# Patient Record
Sex: Female | Born: 1965
Health system: Southern US, Community
[De-identification: ages and names within clinical notes are randomized; demographics above are authoritative.]

## PROBLEM LIST (undated history)

## (undated) DIAGNOSIS — F419 Anxiety disorder, unspecified: Secondary | ICD-10-CM

## (undated) DIAGNOSIS — E7800 Pure hypercholesterolemia, unspecified: Secondary | ICD-10-CM

## (undated) HISTORY — PX: TUBAL LIGATION: SHX77

## (undated) HISTORY — PX: COLONOSCOPY: SHX174

## (undated) HISTORY — DX: Pure hypercholesterolemia, unspecified: E78.00

## (undated) HISTORY — DX: Anxiety disorder, unspecified: F41.9

## (undated) HISTORY — PX: WISDOM TOOTH EXTRACTION: SHX21

## (undated) HISTORY — PX: MANDIBLE FRACTURE SURGERY: SHX706

---

## 1995-03-25 DIAGNOSIS — O321XX0 Maternal care for breech presentation, not applicable or unspecified: Secondary | ICD-10-CM

## 2004-08-28 ENCOUNTER — Ambulatory Visit: Payer: Self-pay | Admitting: General Practice

## 2005-08-27 ENCOUNTER — Ambulatory Visit: Payer: Self-pay | Admitting: General Practice

## 2006-09-03 ENCOUNTER — Ambulatory Visit: Payer: Self-pay | Admitting: General Practice

## 2007-10-11 ENCOUNTER — Ambulatory Visit: Payer: Self-pay

## 2008-09-20 ENCOUNTER — Ambulatory Visit: Payer: Self-pay | Admitting: General Practice

## 2010-10-02 ENCOUNTER — Ambulatory Visit: Payer: Self-pay

## 2010-11-25 ENCOUNTER — Ambulatory Visit: Payer: Self-pay

## 2010-11-26 ENCOUNTER — Ambulatory Visit: Payer: Self-pay

## 2010-11-27 LAB — PATHOLOGY REPORT

## 2011-10-08 ENCOUNTER — Ambulatory Visit: Payer: Self-pay

## 2012-10-13 ENCOUNTER — Ambulatory Visit: Payer: Self-pay | Admitting: Obstetrics and Gynecology

## 2014-10-23 ENCOUNTER — Ambulatory Visit: Payer: Self-pay | Admitting: Obstetrics and Gynecology

## 2014-10-23 LAB — HM MAMMOGRAPHY

## 2014-10-25 ENCOUNTER — Ambulatory Visit: Payer: Self-pay | Admitting: Obstetrics and Gynecology

## 2015-04-23 LAB — HM PAP SMEAR: HM Pap smear: NEGATIVE

## 2015-10-05 ENCOUNTER — Other Ambulatory Visit: Payer: Self-pay | Admitting: Family Medicine

## 2015-10-05 DIAGNOSIS — Z1231 Encounter for screening mammogram for malignant neoplasm of breast: Secondary | ICD-10-CM

## 2015-10-31 ENCOUNTER — Ambulatory Visit
Admission: RE | Admit: 2015-10-31 | Discharge: 2015-10-31 | Disposition: A | Discharge status: Home / Self Care | Payer: BLUE CROSS/BLUE SHIELD | Source: Ambulatory Visit | Attending: Family Medicine | Admitting: Family Medicine

## 2015-10-31 DIAGNOSIS — Z1231 Encounter for screening mammogram for malignant neoplasm of breast: Secondary | ICD-10-CM | POA: Insufficient documentation

## 2015-11-12 DIAGNOSIS — N946 Dysmenorrhea, unspecified: Secondary | ICD-10-CM | POA: Diagnosis not present

## 2015-11-12 DIAGNOSIS — N920 Excessive and frequent menstruation with regular cycle: Secondary | ICD-10-CM | POA: Diagnosis not present

## 2015-11-21 DIAGNOSIS — N920 Excessive and frequent menstruation with regular cycle: Secondary | ICD-10-CM | POA: Diagnosis not present

## 2015-11-21 DIAGNOSIS — N946 Dysmenorrhea, unspecified: Secondary | ICD-10-CM | POA: Diagnosis not present

## 2016-10-14 ENCOUNTER — Ambulatory Visit (INDEPENDENT_AMBULATORY_CARE_PROVIDER_SITE_OTHER): Payer: BLUE CROSS/BLUE SHIELD | Admitting: Obstetrics and Gynecology

## 2016-10-14 ENCOUNTER — Encounter: Payer: Self-pay | Admitting: Obstetrics and Gynecology

## 2016-10-14 VITALS — BP 140/90 | HR 78 | Ht 65.0 in | Wt 160.0 lb

## 2016-10-14 DIAGNOSIS — Z1231 Encounter for screening mammogram for malignant neoplasm of breast: Secondary | ICD-10-CM

## 2016-10-14 DIAGNOSIS — Z1211 Encounter for screening for malignant neoplasm of colon: Secondary | ICD-10-CM | POA: Diagnosis not present

## 2016-10-14 DIAGNOSIS — Z1239 Encounter for other screening for malignant neoplasm of breast: Secondary | ICD-10-CM

## 2016-10-14 DIAGNOSIS — Z720 Tobacco use: Secondary | ICD-10-CM | POA: Diagnosis not present

## 2016-10-14 DIAGNOSIS — Z01419 Encounter for gynecological examination (general) (routine) without abnormal findings: Secondary | ICD-10-CM

## 2016-10-14 DIAGNOSIS — N951 Menopausal and female climacteric states: Secondary | ICD-10-CM | POA: Diagnosis not present

## 2016-10-14 LAB — HEMOCCULT GUIAC POC 1CARD (OFFICE): FECAL OCCULT BLD: NEGATIVE

## 2016-10-14 NOTE — Progress Notes (Signed)
HPI:      [name] is a 51 y.o. No obstetric history on file. who LMP was Patient's last menstrual period was 10/11/2016., presents today for her annual examination.  Her menses are infrequent since perimenopausal. She had gone 6 months without menses. She had menorrhagia/DUB last yr and had neg eval/bx/SHGM with Dr. Georgianne Fick. He started her on camila OCPs. Pt stopped them due to bloating and odor. She has not had any DUB sx since.   Dysmenorrhea none.   She does have vasomotor sx. Tolerable.  She is single partner, contraception - tubal ligation. She does not have vaginal dryness.  Last Pap: April 23, 2015  Results were: no abnormalities /neg HPV DNA.  Hx of STDs: none  Last mammogram: October 31, 2015  Results were: normal--routine follow-up in 12 months There is no FH of breast cancer. There is no FH of ovarian cancer. The patient does do self-breast exams.  Colonoscopy: not done.   Tobacco use: The patient currently smokes 1/2  packs of cigarettes per day for the past many years. Alcohol use: social drinker Exercise: not active  She does get adequate calcium and Vitamin D in her diet.   Past Medical History:  Diagnosis Date  . Anxiety    SITUATIONAL  . Hypercholesteremia     Past Surgical History:  Procedure Laterality Date  . CESAREAN SECTION  03/25/1995   BREECH  . MANDIBLE FRACTURE SURGERY     AGE 77  . TUBAL LIGATION     PJR  . WISDOM TOOTH EXTRACTION     20S    Family History  Problem Relation Age of Onset  . Hyperlipidemia Mother   . Heart disease Father   . Stroke Father   . Diabetes Father   . Hyperlipidemia Sister      ROS:  Review of Systems  Constitutional: Negative for fever, malaise/fatigue and weight loss.  HENT: Negative for congestion, ear pain and sinus pain.   Respiratory: Negative for cough, shortness of breath and wheezing.   Cardiovascular: Negative for chest pain, orthopnea and leg swelling.  Gastrointestinal:  Negative for constipation, diarrhea, nausea and vomiting.  Genitourinary: Negative for dysuria, frequency, hematuria and urgency.       Breast ROS: negative   Musculoskeletal: Negative for back pain, joint pain and myalgias.  Skin: Negative for itching and rash.  Neurological: Negative for dizziness, tingling, focal weakness and headaches.  Endo/Heme/Allergies: Negative for environmental allergies. Does not bruise/bleed easily.  Psychiatric/Behavioral: Negative for depression and suicidal ideas. The patient is not nervous/anxious and does not have insomnia.     Objective: BP 140/90 (Patient Position: Sitting)   Pulse 78   Wt 160 lb (72.6 kg)   LMP 10/11/2016    Physical Exam  Genitourinary: Rectum normal. Rectal exam shows guaiac negative stool.    Results: Results for orders placed or performed in visit on 10/14/16 (from the past 24 hour(s))  POCT Occult Blood Stool     Status: Normal   Collection Time: 10/14/16  4:01 PM  Result Value Ref Range   Fecal Occult Blood, POC Negative Negative   Card #1 Date     Card #2 Fecal Occult Blod, POC     Card #2 Date     Card #3 Fecal Occult Blood, POC     Card #3 Date      Assessment/Plan: Encounter for annual routine gynecological examination  Screening for breast cancer - Plan: MM DIGITAL SCREENING BILATERAL  Screening for colon cancer - Neg FOBT. Pt to consider GI ref for scr colonoscopy due to age. - Plan: POCT Occult Blood Stool  Perimenopause - F/u prn DUB.   Tobacco use - Encouraged cessation.            GYN counsel mammography screening, menopause, adequate intake of calcium and vitamin D     F/U  Return in about 1 year (around 10/14/2017).  Gorden Stthomas B. Ireoluwa Gorsline, PA-C 10/14/2016 4:03 PM

## 2017-01-02 ENCOUNTER — Ambulatory Visit
Admission: RE | Admit: 2017-01-02 | Discharge: 2017-01-02 | Disposition: A | Discharge status: Home / Self Care | Payer: BLUE CROSS/BLUE SHIELD | Source: Ambulatory Visit | Attending: Obstetrics and Gynecology | Admitting: Obstetrics and Gynecology

## 2017-01-02 DIAGNOSIS — Z1231 Encounter for screening mammogram for malignant neoplasm of breast: Secondary | ICD-10-CM | POA: Insufficient documentation

## 2017-04-29 DIAGNOSIS — H524 Presbyopia: Secondary | ICD-10-CM | POA: Diagnosis not present

## 2017-10-13 DIAGNOSIS — Z1211 Encounter for screening for malignant neoplasm of colon: Secondary | ICD-10-CM | POA: Diagnosis not present

## 2017-10-13 DIAGNOSIS — Z01818 Encounter for other preprocedural examination: Secondary | ICD-10-CM | POA: Diagnosis not present

## 2017-10-20 ENCOUNTER — Other Ambulatory Visit: Payer: Self-pay | Admitting: Family Medicine

## 2017-11-30 DIAGNOSIS — D125 Benign neoplasm of sigmoid colon: Secondary | ICD-10-CM | POA: Diagnosis not present

## 2017-11-30 DIAGNOSIS — D126 Benign neoplasm of colon, unspecified: Secondary | ICD-10-CM | POA: Diagnosis not present

## 2017-11-30 DIAGNOSIS — K648 Other hemorrhoids: Secondary | ICD-10-CM | POA: Diagnosis not present

## 2017-11-30 DIAGNOSIS — D123 Benign neoplasm of transverse colon: Secondary | ICD-10-CM | POA: Diagnosis not present

## 2017-11-30 DIAGNOSIS — K640 First degree hemorrhoids: Secondary | ICD-10-CM | POA: Diagnosis not present

## 2017-11-30 DIAGNOSIS — Z1211 Encounter for screening for malignant neoplasm of colon: Secondary | ICD-10-CM | POA: Diagnosis not present

## 2018-01-28 DIAGNOSIS — R51 Headache: Secondary | ICD-10-CM | POA: Diagnosis not present

## 2018-01-28 DIAGNOSIS — I10 Essential (primary) hypertension: Secondary | ICD-10-CM | POA: Diagnosis not present

## 2018-03-15 DIAGNOSIS — L570 Actinic keratosis: Secondary | ICD-10-CM | POA: Diagnosis not present

## 2018-03-15 DIAGNOSIS — L905 Scar conditions and fibrosis of skin: Secondary | ICD-10-CM | POA: Diagnosis not present

## 2018-03-15 DIAGNOSIS — D224 Melanocytic nevi of scalp and neck: Secondary | ICD-10-CM | POA: Diagnosis not present

## 2018-03-15 DIAGNOSIS — L739 Follicular disorder, unspecified: Secondary | ICD-10-CM | POA: Diagnosis not present

## 2018-03-15 DIAGNOSIS — L821 Other seborrheic keratosis: Secondary | ICD-10-CM | POA: Diagnosis not present

## 2018-03-25 ENCOUNTER — Other Ambulatory Visit: Payer: Self-pay | Admitting: Physician Assistant

## 2018-03-25 DIAGNOSIS — Z1231 Encounter for screening mammogram for malignant neoplasm of breast: Secondary | ICD-10-CM

## 2018-04-12 ENCOUNTER — Other Ambulatory Visit (HOSPITAL_COMMUNITY)
Admission: RE | Admit: 2018-04-12 | Discharge: 2018-04-12 | Disposition: A | Discharge status: Home / Self Care | Payer: BLUE CROSS/BLUE SHIELD | Source: Ambulatory Visit | Attending: Obstetrics and Gynecology | Admitting: Obstetrics and Gynecology

## 2018-04-12 ENCOUNTER — Encounter: Payer: Self-pay | Admitting: Obstetrics and Gynecology

## 2018-04-12 ENCOUNTER — Ambulatory Visit (INDEPENDENT_AMBULATORY_CARE_PROVIDER_SITE_OTHER): Payer: BLUE CROSS/BLUE SHIELD | Admitting: Obstetrics and Gynecology

## 2018-04-12 VITALS — BP 100/60 | HR 72 | Ht 65.0 in | Wt 161.0 lb

## 2018-04-12 DIAGNOSIS — Z1151 Encounter for screening for human papillomavirus (HPV): Secondary | ICD-10-CM | POA: Insufficient documentation

## 2018-04-12 DIAGNOSIS — Z124 Encounter for screening for malignant neoplasm of cervix: Secondary | ICD-10-CM | POA: Insufficient documentation

## 2018-04-12 DIAGNOSIS — Z01419 Encounter for gynecological examination (general) (routine) without abnormal findings: Secondary | ICD-10-CM

## 2018-04-12 DIAGNOSIS — Z1231 Encounter for screening mammogram for malignant neoplasm of breast: Secondary | ICD-10-CM | POA: Diagnosis not present

## 2018-04-12 DIAGNOSIS — Z1239 Encounter for other screening for malignant neoplasm of breast: Secondary | ICD-10-CM

## 2018-04-12 NOTE — Progress Notes (Signed)
HPI:      [name] is a 52 y.o. No obstetric history on file. who LMP was No LMP recorded., presents today for her annual examination.  Her menses are absent due to menopause. No DUB sx.   She does have vasomotor sx. Tolerable.  She is single partner, contraception - tubal ligation. She does not have vaginal dryness.  Last Pap: April 23, 2015  Results were: no abnormalities /neg HPV DNA.  Hx of STDs: none  Last mammogram: 01/02/17 Results were: normal--routine follow-up in 12 months. Has appt 04/16/18.  There is no FH of breast cancer. There is no FH of ovarian cancer. The patient does do self-breast exams.  Colonoscopy: this yr with polyps; repeat after 3 yrs  Tobacco use: The patient currently smokes 1/2  packs of cigarettes per day for the past many years. Alcohol use: social drinker Exercise: mod active  She does get adequate calcium but not Vitamin D in her diet. Labs with PCP   Past Medical History:  Diagnosis Date  . Anxiety    SITUATIONAL  . Hypercholesteremia     Past Surgical History:  Procedure Laterality Date  . CESAREAN SECTION  03/25/1995   BREECH  . MANDIBLE FRACTURE SURGERY     AGE 32  . TUBAL LIGATION     PJR  . WISDOM TOOTH EXTRACTION     20S    Family History  Problem Relation Age of Onset  . Hyperlipidemia Mother   . Heart disease Father   . Stroke Father   . Diabetes Father   . Hyperlipidemia Sister   . Breast cancer Neg Hx      ROS:  Review of Systems  Constitutional: Negative for fever, malaise/fatigue and weight loss.  HENT: Negative for congestion, ear pain and sinus pain.   Respiratory: Negative for cough, shortness of breath and wheezing.   Cardiovascular: Negative for chest pain, orthopnea and leg swelling.  Gastrointestinal: Negative for constipation, diarrhea, nausea and vomiting.  Genitourinary: Negative for dysuria, frequency, hematuria and urgency.       Breast ROS: negative   Musculoskeletal: Negative  for back pain, joint pain and myalgias.  Skin: Negative for itching and rash.  Neurological: Negative for dizziness, tingling, focal weakness and headaches.  Endo/Heme/Allergies: Negative for environmental allergies. Does not bruise/bleed easily.  Psychiatric/Behavioral: Negative for depression and suicidal ideas. The patient is not nervous/anxious and does not have insomnia.     Objective: BP 100/60   Pulse 72   Ht 5\' 5"  (1.651 m)   Wt 161 lb (73 kg)   BMI 26.79 kg/m    Physical Exam  Constitutional: She is oriented to person, place, and time. She appears well-developed and well-nourished.  Genitourinary: Vagina normal and uterus normal. There is no rash or tenderness on the right labia. There is no rash or tenderness on the left labia. No erythema or tenderness in the vagina. No vaginal discharge found. Right adnexum does not display mass and does not display tenderness. Left adnexum does not display mass and does not display tenderness. Cervix does not exhibit motion tenderness or polyp. Uterus is not enlarged or tender.  Neck: Normal range of motion. No thyromegaly present.  Cardiovascular: Normal rate, regular rhythm and normal heart sounds.  No murmur heard. Pulmonary/Chest: Effort normal and breath sounds normal. Right breast exhibits no mass, no nipple discharge, no skin change and no tenderness. Left breast exhibits no mass, no nipple discharge, no skin change and no  tenderness.  Abdominal: Soft. There is no tenderness. There is no guarding.  Musculoskeletal: Normal range of motion.  Neurological: She is alert and oriented to person, place, and time. No cranial nerve deficit.  Psychiatric: She has a normal mood and affect. Her behavior is normal.  Vitals reviewed.   Assessment/Plan: Encounter for annual routine gynecological examination  Cervical cancer screening - Plan: Cytology - PAP  Screening for HPV (human papillomavirus) - Plan: Cytology - PAP  Screening for breast  cancer - Pt has mammo sched.          GYN counsel mammography screening, menopause, adequate intake of calcium and vitamin D     F/U  Return in about 1 year (around 04/13/2019).  Alicia B. Copland, PA-C 04/12/2018 3:14 PM

## 2018-04-12 NOTE — Patient Instructions (Signed)
I value your feedback and entrusting us with your care. If you get a [facility] patient survey, I would appreciate you taking the time to let us know about your experience today. Thank you! 

## 2018-04-15 LAB — CYTOLOGY - PAP
Diagnosis: NEGATIVE
HPV: NOT DETECTED

## 2018-04-16 ENCOUNTER — Ambulatory Visit
Admission: RE | Admit: 2018-04-16 | Discharge: 2018-04-16 | Disposition: A | Discharge status: Home / Self Care | Payer: BLUE CROSS/BLUE SHIELD | Source: Ambulatory Visit | Attending: Physician Assistant | Admitting: Physician Assistant

## 2018-04-16 DIAGNOSIS — Z1231 Encounter for screening mammogram for malignant neoplasm of breast: Secondary | ICD-10-CM | POA: Insufficient documentation

## 2018-04-19 ENCOUNTER — Other Ambulatory Visit: Payer: Self-pay | Admitting: Physician Assistant

## 2018-04-19 DIAGNOSIS — R928 Other abnormal and inconclusive findings on diagnostic imaging of breast: Secondary | ICD-10-CM

## 2018-04-28 ENCOUNTER — Ambulatory Visit
Admission: RE | Admit: 2018-04-28 | Discharge: 2018-04-28 | Disposition: A | Discharge status: Home / Self Care | Payer: BLUE CROSS/BLUE SHIELD | Source: Ambulatory Visit | Attending: Physician Assistant | Admitting: Physician Assistant

## 2018-04-28 DIAGNOSIS — R928 Other abnormal and inconclusive findings on diagnostic imaging of breast: Secondary | ICD-10-CM | POA: Insufficient documentation

## 2018-04-28 DIAGNOSIS — R922 Inconclusive mammogram: Secondary | ICD-10-CM | POA: Diagnosis not present

## 2018-04-28 DIAGNOSIS — N6489 Other specified disorders of breast: Secondary | ICD-10-CM | POA: Diagnosis not present

## 2018-05-05 DIAGNOSIS — H524 Presbyopia: Secondary | ICD-10-CM | POA: Diagnosis not present

## 2018-05-18 IMAGING — MG MM DIGITAL SCREENING BILAT W/ TOMO W/ CAD
8 of 12 series · 8 of 28 positions shown · non-contrast
Comparison: Previous exam(s).

CLINICAL DATA: Screening.

EXAM:
2D DIGITAL SCREENING BILATERAL MAMMOGRAM WITH CAD AND ADJUNCT TOMO

[L CC]
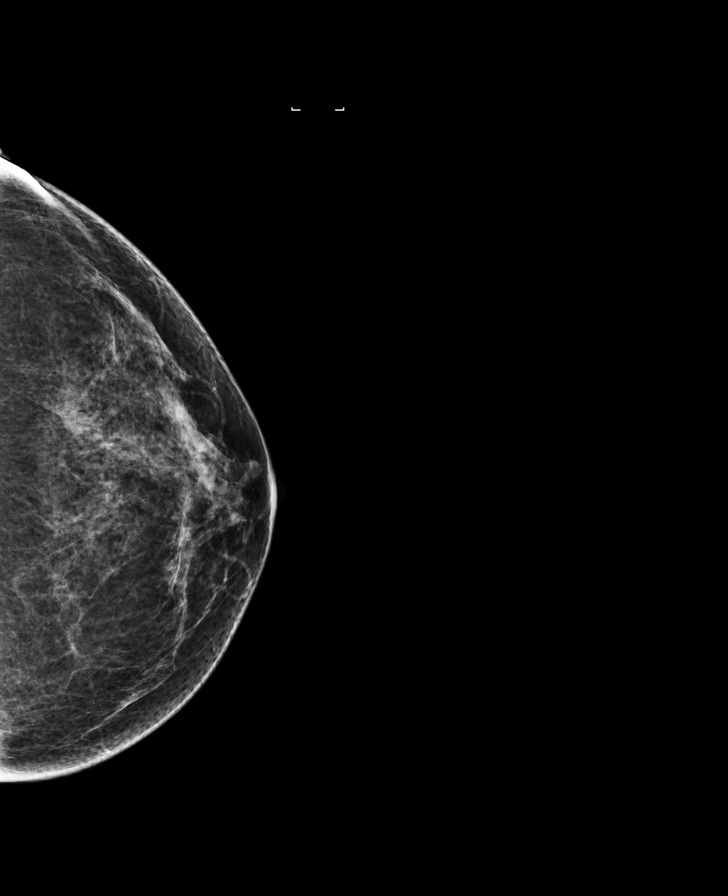

[R CC]
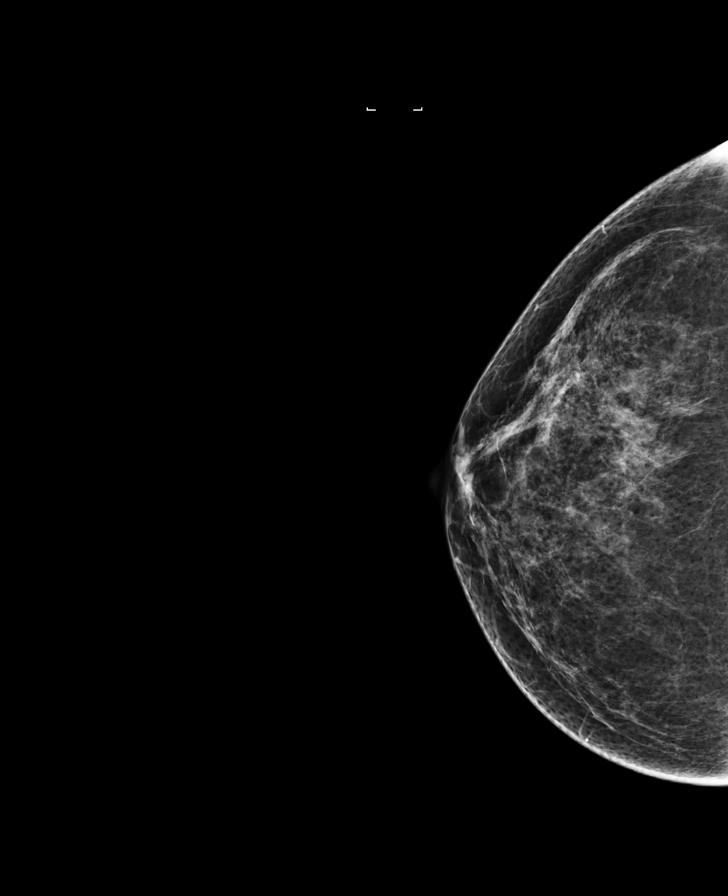

[R CC synth-2D]
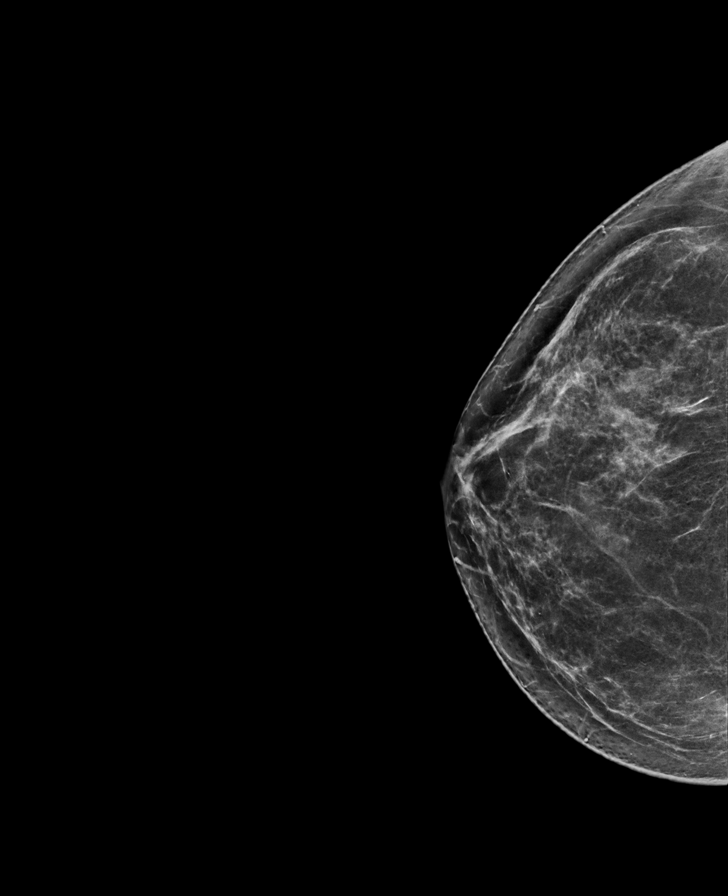

[R MLO synth-2D]
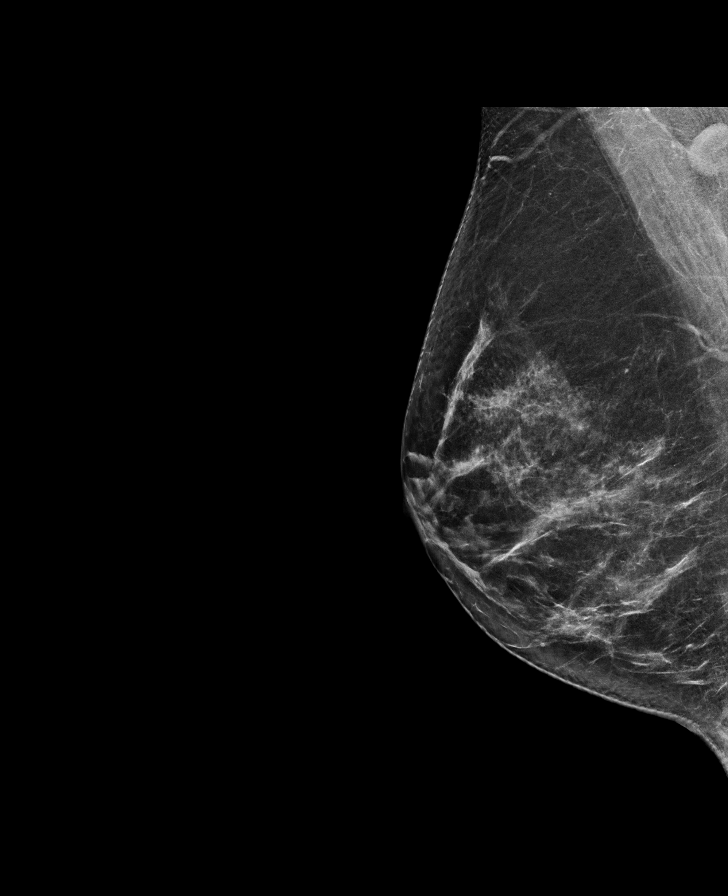

[L CC synth-2D]
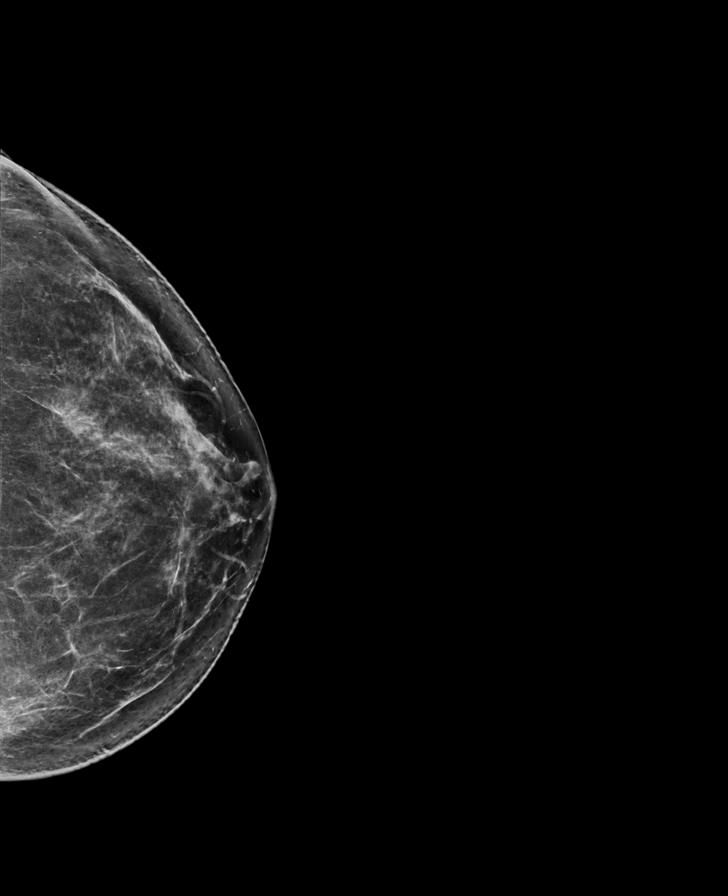

[R MLO]
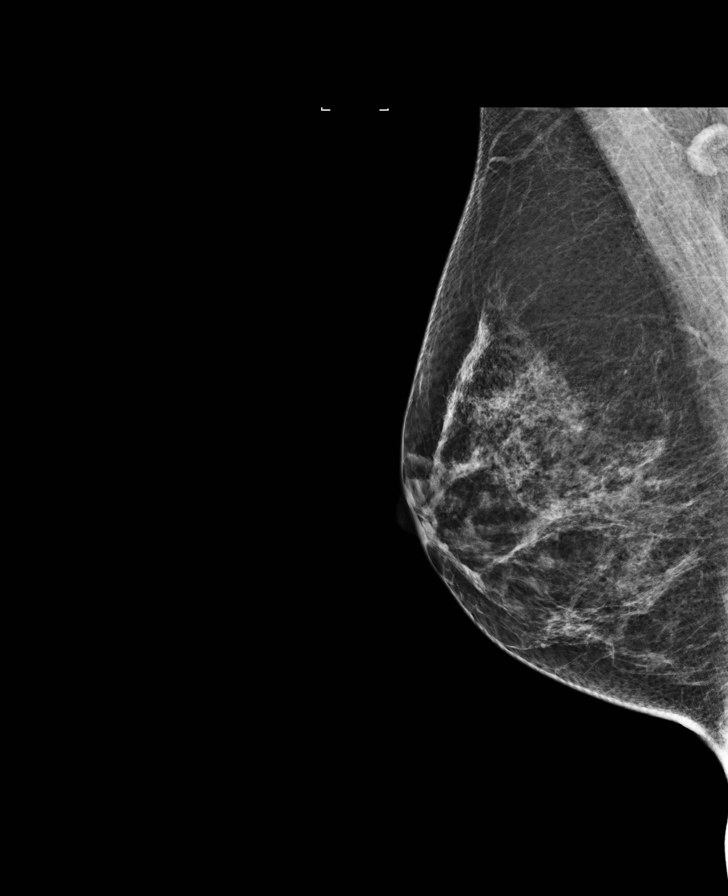

[L MLO synth-2D]
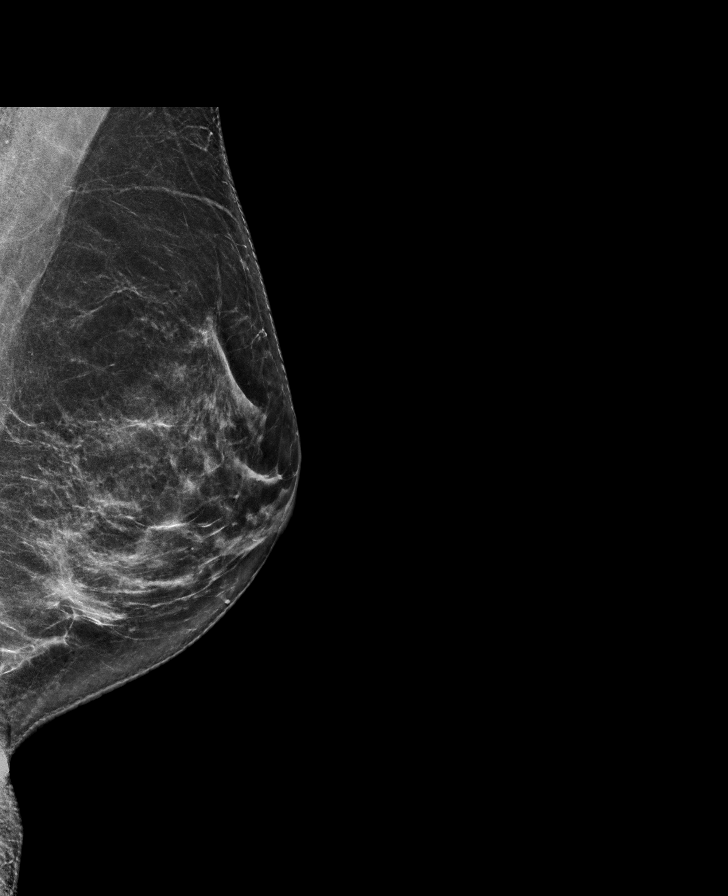

[L MLO]
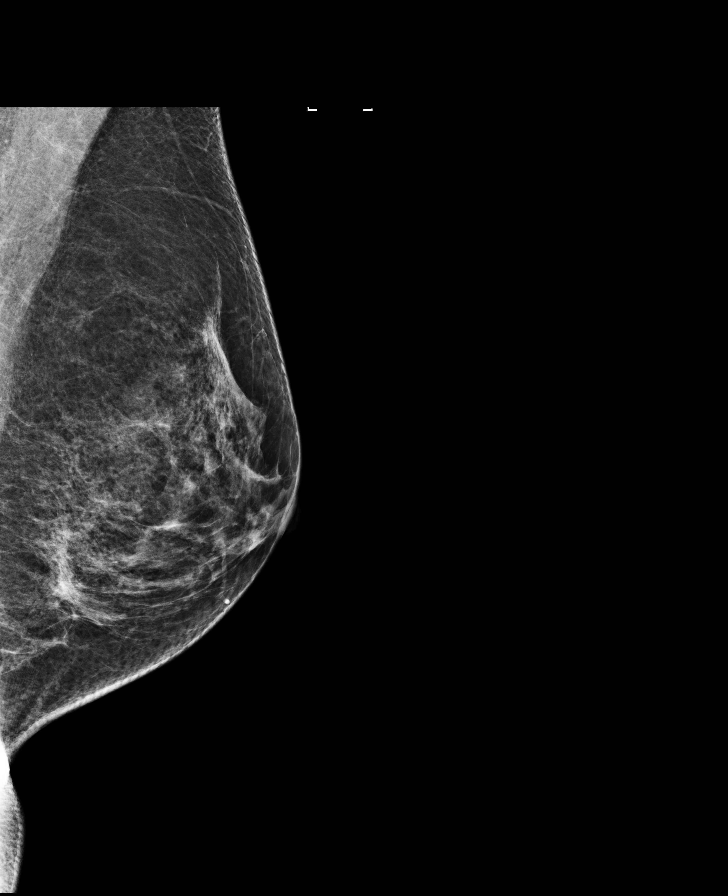

[8 of 28 positions shown; findings below may reference images not displayed]

ACR Breast Density Category b: There are scattered areas of
fibroglandular density.
FINDINGS: There are no findings suspicious for malignancy. Images were
processed with CAD.
IMPRESSION: No mammographic evidence of malignancy. A result letter of this
screening mammogram will be mailed directly to the patient.

RECOMMENDATION:
Screening mammogram in one year. (Code:97-6-RS4)

BI-RADS CATEGORY  1: Negative.

## 2018-05-21 ENCOUNTER — Other Ambulatory Visit: Payer: Self-pay | Admitting: Physician Assistant

## 2018-06-16 DIAGNOSIS — L821 Other seborrheic keratosis: Secondary | ICD-10-CM | POA: Diagnosis not present

## 2018-06-16 DIAGNOSIS — L578 Other skin changes due to chronic exposure to nonionizing radiation: Secondary | ICD-10-CM | POA: Diagnosis not present

## 2018-06-16 DIAGNOSIS — L812 Freckles: Secondary | ICD-10-CM | POA: Diagnosis not present

## 2018-06-16 DIAGNOSIS — L570 Actinic keratosis: Secondary | ICD-10-CM | POA: Diagnosis not present

## 2018-12-13 DIAGNOSIS — B351 Tinea unguium: Secondary | ICD-10-CM | POA: Diagnosis not present

## 2018-12-13 DIAGNOSIS — B359 Dermatophytosis, unspecified: Secondary | ICD-10-CM | POA: Diagnosis not present

## 2018-12-13 DIAGNOSIS — D485 Neoplasm of uncertain behavior of skin: Secondary | ICD-10-CM | POA: Diagnosis not present

## 2018-12-13 DIAGNOSIS — L920 Granuloma annulare: Secondary | ICD-10-CM | POA: Diagnosis not present

## 2018-12-20 DIAGNOSIS — L920 Granuloma annulare: Secondary | ICD-10-CM | POA: Diagnosis not present

## 2019-06-16 ENCOUNTER — Encounter: Payer: Self-pay | Admitting: Obstetrics and Gynecology

## 2019-06-16 ENCOUNTER — Ambulatory Visit (INDEPENDENT_AMBULATORY_CARE_PROVIDER_SITE_OTHER): Payer: 59 | Admitting: Obstetrics and Gynecology

## 2019-06-16 ENCOUNTER — Other Ambulatory Visit: Payer: Self-pay

## 2019-06-16 VITALS — BP 130/100 | Ht 65.0 in | Wt 158.0 lb

## 2019-06-16 DIAGNOSIS — Z01419 Encounter for gynecological examination (general) (routine) without abnormal findings: Secondary | ICD-10-CM | POA: Diagnosis not present

## 2019-06-16 DIAGNOSIS — Z1231 Encounter for screening mammogram for malignant neoplasm of breast: Secondary | ICD-10-CM

## 2019-06-16 NOTE — Progress Notes (Addendum)
HPI:      [name] is a 53 y.o. No obstetric history on file. who LMP was Patient's last menstrual period was 10/11/2016., presents today for her annual examination.  Her menses are absent due to menopause. No DUB sx.  She does have vasomotor sx, terrible now. Declines HRT.   She is single partner, contraception - tubal ligation. She does not have vaginal dryness.  Last Pap: 04/12/18 Results were: no abnormalities /neg HPV DNA.  Hx of STDs: none  Last mammogram: 04/28/18 Results were: normal--routine follow-up in 12 months. There is no FH of breast cancer. There is no FH of ovarian cancer. The patient does do self-breast exams.  Colonoscopy: 2019 with polyps; repeat after 3 yrs  Tobacco use: The patient currently smokes 1/2  packs of cigarettes per day for the past many years. Alcohol use: social drinker  No drug use. Exercise: mod active  She does get adequate calcium but not Vitamin D in her diet. Labs with PCP--needs lipid meds and plans to sched appt.   Past Medical History:  Diagnosis Date  . Anxiety    SITUATIONAL  . Hypercholesteremia     Past Surgical History:  Procedure Laterality Date  . CESAREAN SECTION  03/25/1995   BREECH  . COLONOSCOPY    . MANDIBLE FRACTURE SURGERY     AGE 77  . TUBAL LIGATION     PJR  . WISDOM TOOTH EXTRACTION     20S    Family History  Problem Relation Age of Onset  . Hyperlipidemia Mother   . Heart disease Father   . Stroke Father   . Diabetes Father   . Hyperlipidemia Sister   . Breast cancer Neg Hx      ROS:  Review of Systems  Constitutional: Negative for fatigue, fever and unexpected weight change.  Respiratory: Negative for cough, shortness of breath and wheezing.   Cardiovascular: Negative for chest pain, palpitations and leg swelling.  Gastrointestinal: Negative for blood in stool, constipation, diarrhea, nausea and vomiting.  Endocrine: Negative for cold intolerance, heat intolerance and polyuria.   Genitourinary: Negative for dyspareunia, dysuria, flank pain, frequency, genital sores, hematuria, menstrual problem, pelvic pain, urgency, vaginal bleeding, vaginal discharge and vaginal pain.  Musculoskeletal: Negative for back pain, joint swelling and myalgias.  Skin: Negative for rash.  Neurological: Negative for dizziness, syncope, light-headedness, numbness and headaches.  Hematological: Negative for adenopathy.  Psychiatric/Behavioral: Positive for agitation. Negative for confusion, sleep disturbance and suicidal ideas. The patient is not nervous/anxious.      Objective: BP (!) 130/100   Ht 5\' 5"  (1.651 m)   Wt 158 lb (71.7 kg)   LMP 10/11/2016   BMI 26.29 kg/m    Physical Exam Constitutional:      Appearance: She is well-developed.  Genitourinary:     Vulva, vagina, uterus, right adnexa and left adnexa normal.     No vulval lesion or tenderness noted.     No vaginal discharge, erythema or tenderness.     No cervical motion tenderness or polyp.     Uterus is not enlarged or tender.     No right or left adnexal mass present.     Right adnexa not tender.     Left adnexa not tender.  Neck:     Musculoskeletal: Normal range of motion.     Thyroid: No thyromegaly.  Cardiovascular:     Rate and Rhythm: Normal rate and regular rhythm.     Heart  sounds: Normal heart sounds. No murmur.  Pulmonary:     Effort: Pulmonary effort is normal.     Breath sounds: Normal breath sounds.  Chest:     Breasts:        Right: No mass, nipple discharge, skin change or tenderness.        Left: No mass, nipple discharge, skin change or tenderness.  Abdominal:     Palpations: Abdomen is soft.     Tenderness: There is no abdominal tenderness. There is no guarding.  Musculoskeletal: Normal range of motion.  Neurological:     General: No focal deficit present.     Mental Status: She is alert and oriented to person, place, and time.     Cranial Nerves: No cranial nerve deficit.  Skin:     General: Skin is warm and dry.  Psychiatric:        Mood and Affect: Mood normal.        Behavior: Behavior normal.        Thought Content: Thought content normal.        Judgment: Judgment normal.  Vitals signs reviewed.     Assessment/Plan: Encounter for annual routine gynecological examination  Encounter for screening mammogram for malignant neoplasm of breast - Plan: 3D MAMMOGRAM SCREENING BILATERAL; pt scehd mammo today  Vasomotor symptoms due to menopause--declines HRT. Try estroven.  Elevated blood pressure reading--pt under increased stress with work. Has f/u with PCP.         GYN counsel mammography screening, menopause, adequate intake of calcium and vitamin D     F/U  Return in about 1 year (around 06/15/2020).  Alicia B. Copland, PA-C 06/16/2019 1:48 PM

## 2019-06-16 NOTE — Patient Instructions (Signed)
I value your feedback and entrusting us with your care. If you get a [facility] patient survey, I would appreciate you taking the time to let us know about your experience today. Thank you!  [facility]: [phone]    

## 2019-09-11 IMAGING — MG MM DIGITAL DIAGNOSTIC UNILAT*R* W/ TOMO W/ CAD
4 series · 4 of 12 positions shown · non-contrast
Comparison: 04/16/2018

CLINICAL DATA: The patient returns after screening study for
evaluation possible RIGHT breast distortion.

EXAM:
DIGITAL DIAGNOSTIC RIGHT MAMMOGRAM WITH CAD AND TOMO
ULTRASOUND RIGHT BREAST

[R MLO synth-2D]
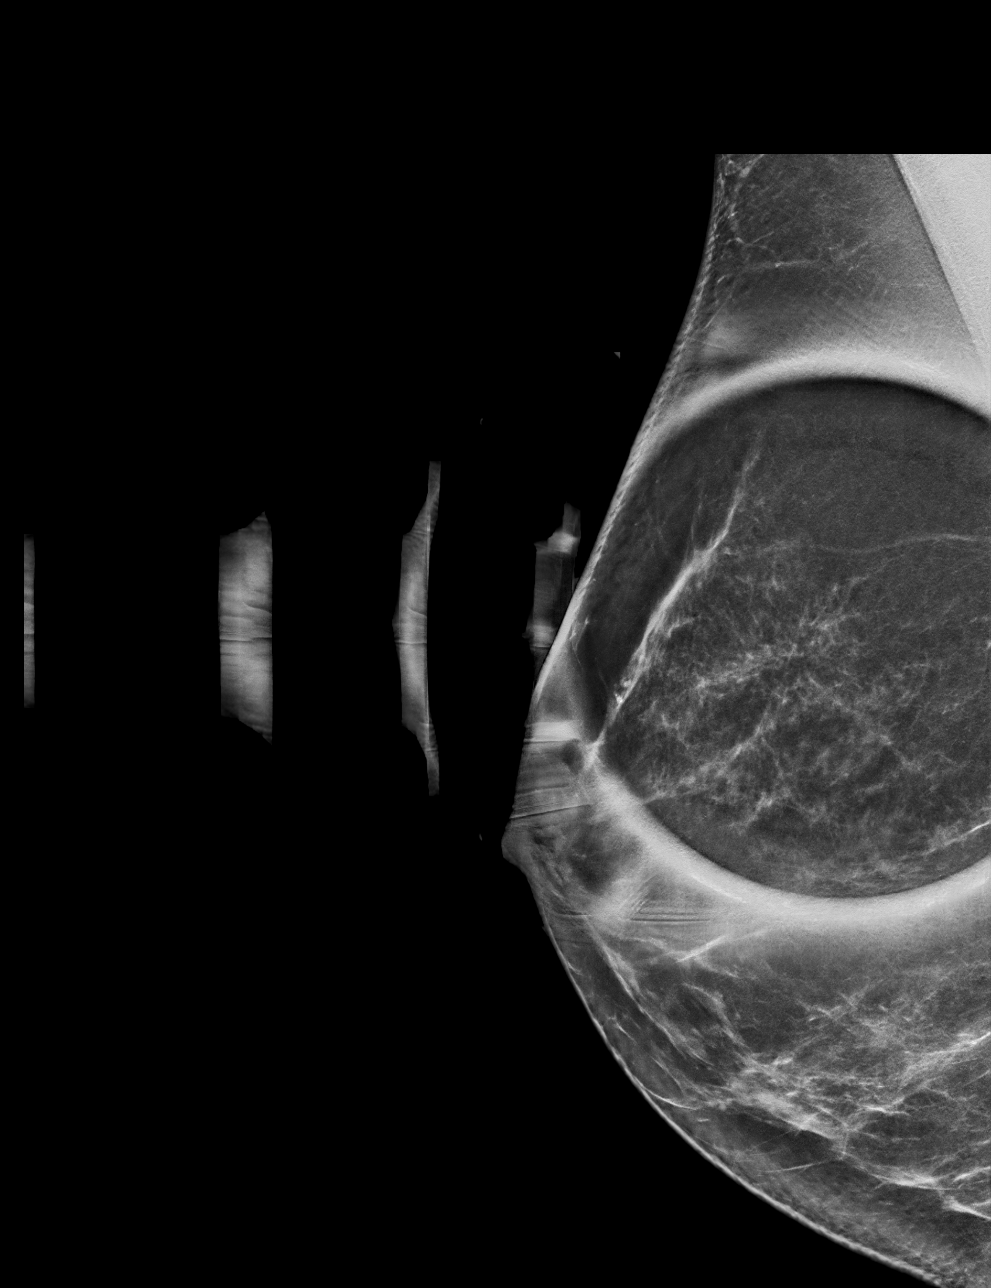

[R CC synth-2D]
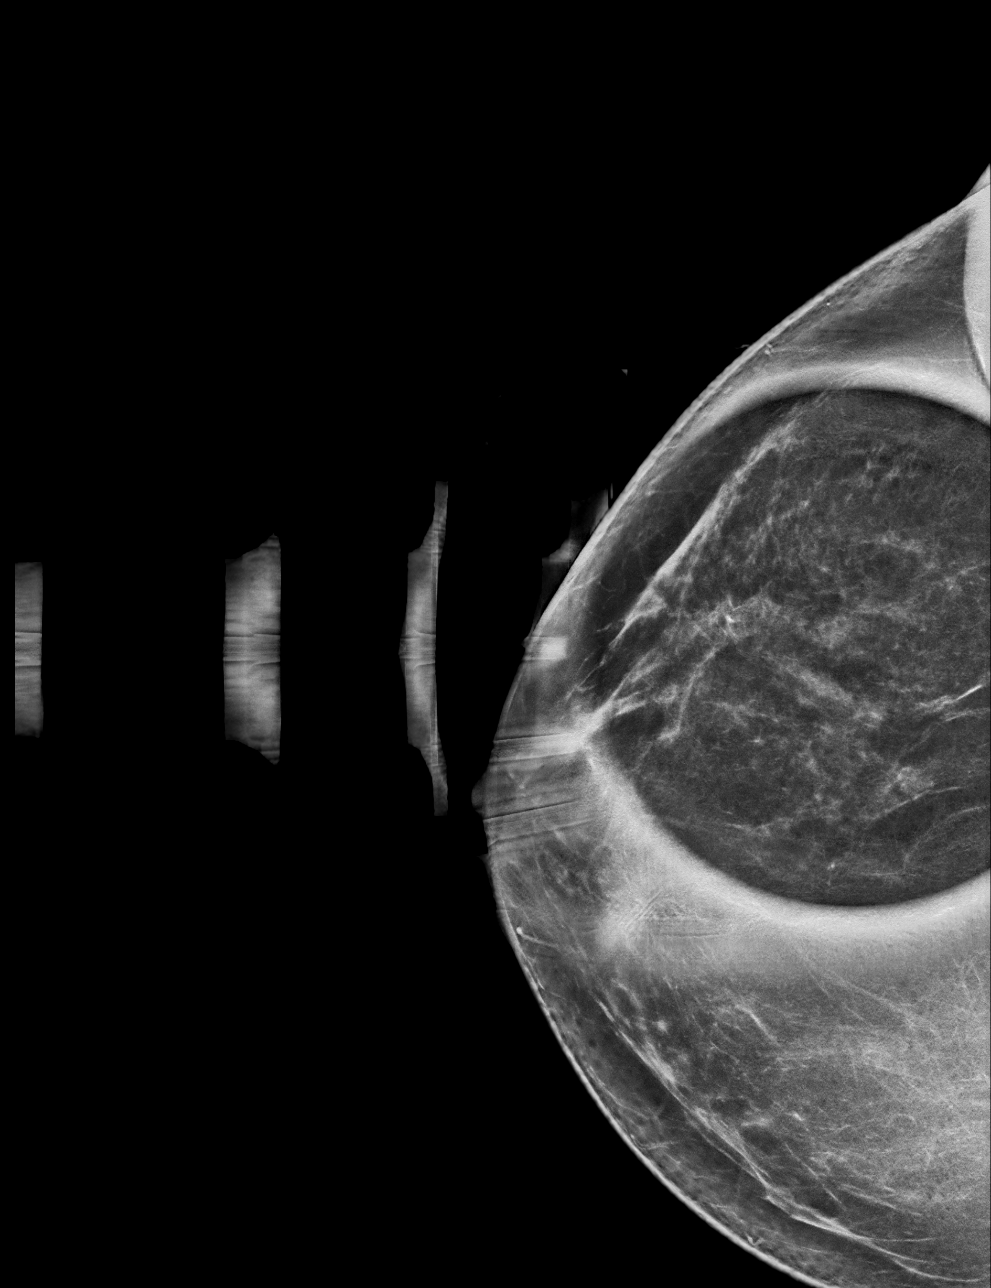

[R CC tomo · tomo slice 38/75.0]
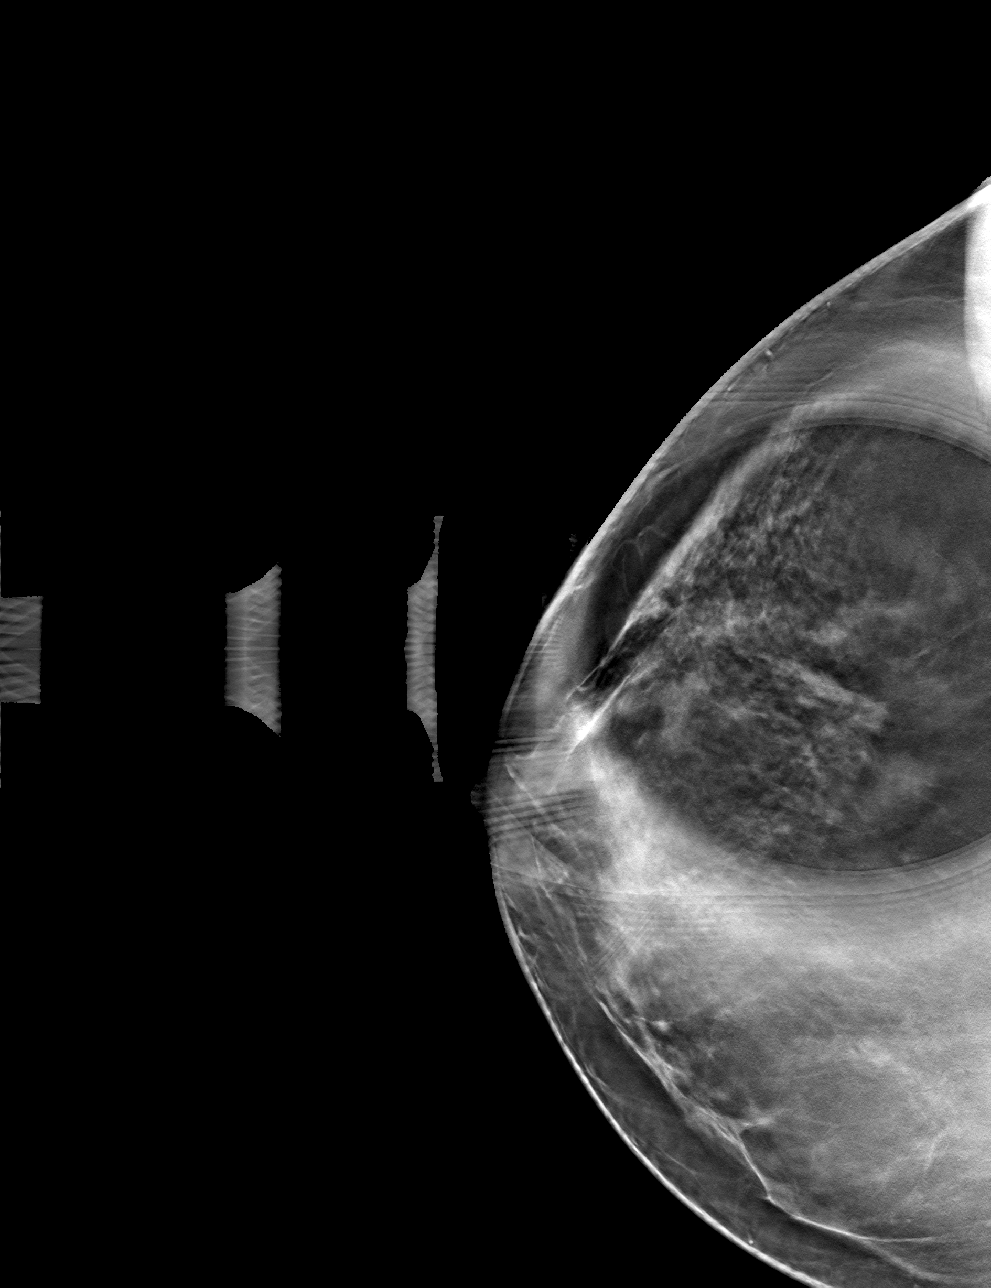

[R MLO tomo · tomo slice 39/76.0]
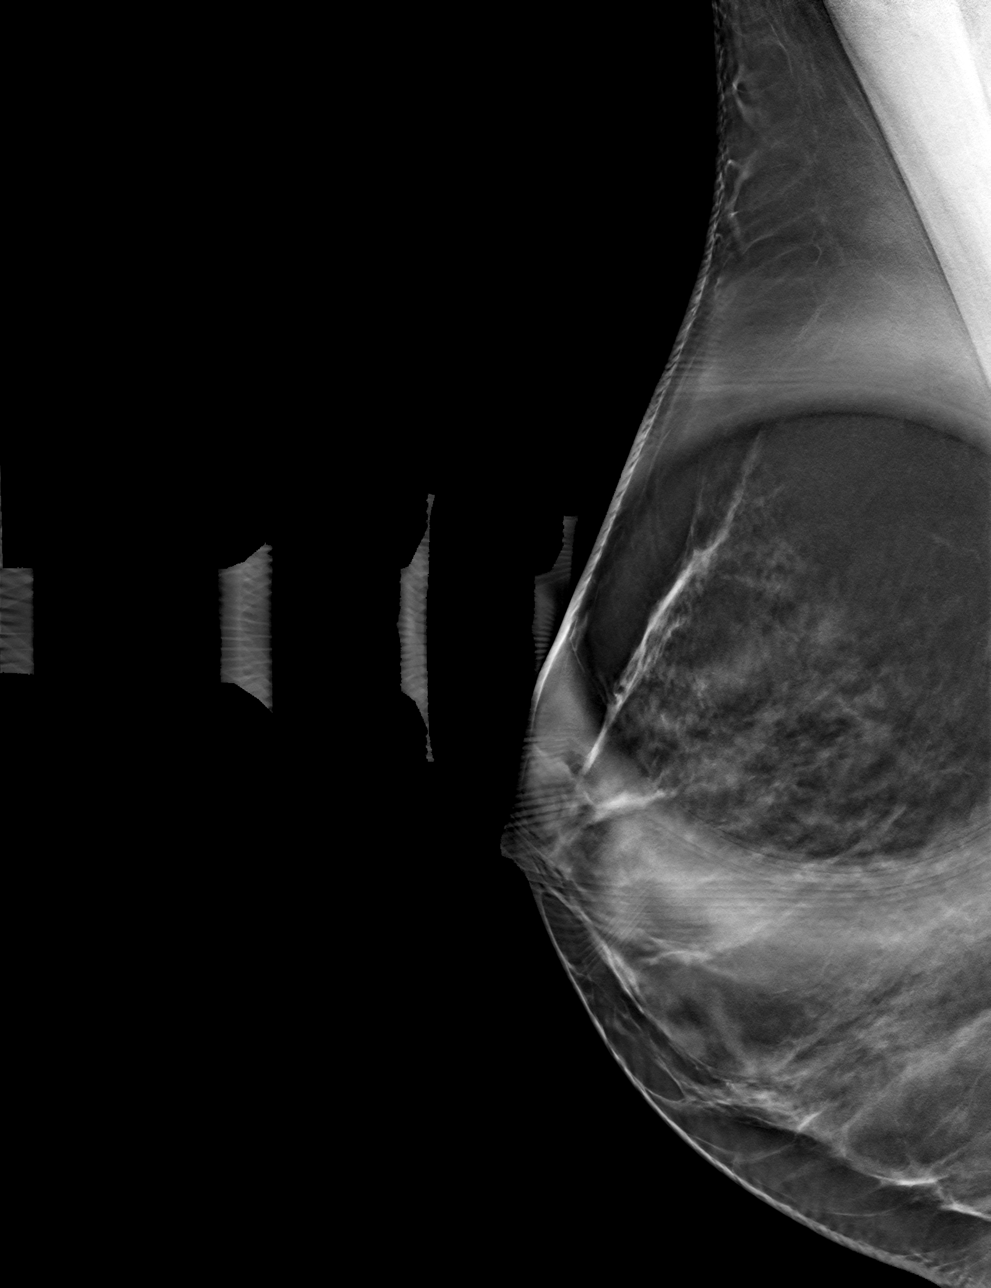

[4 of 12 positions shown; findings below may reference images not displayed]

ACR Breast Density Category c: The breast tissue is heterogeneously
dense, which may obscure small masses.
FINDINGS: Additional 2-D and 3-D images are performed. These views demonstrate
dense fibroglandular tissue without persistent distortion in the
UPPER-OUTER QUADRANT of the RIGHT breast.

Mammographic images were processed with CAD.

On physical exam, I palpate no abnormality in the LATERAL aspect of
the RIGHT breast.

Targeted ultrasound is performed, showing normal appearing dense
fibroglandular tissue throughout the LATERAL quadrants of the RIGHT
breast. No suspicious mass, distortion, or acoustic shadowing is
demonstrated with ultrasound.
IMPRESSION: No mammographic or ultrasound evidence for malignancy.

RECOMMENDATION:
Screening mammogram in one year.(Code:[id number]-4XN)

I have discussed the findings and recommendations with the patient.
Results were also provided in writing at the conclusion of the
visit. If applicable, a reminder letter will be sent to the patient
regarding the next appointment.

BI-RADS CATEGORY  1: Negative.

## 2019-11-03 ENCOUNTER — Ambulatory Visit
Admission: RE | Admit: 2019-11-03 | Discharge: 2019-11-03 | Disposition: A | Discharge status: Home / Self Care | Payer: 59 | Source: Ambulatory Visit | Attending: Obstetrics and Gynecology | Admitting: Obstetrics and Gynecology

## 2019-11-03 ENCOUNTER — Encounter: Payer: Self-pay | Admitting: Obstetrics and Gynecology

## 2019-11-03 DIAGNOSIS — Z1231 Encounter for screening mammogram for malignant neoplasm of breast: Secondary | ICD-10-CM

## 2019-11-24 DIAGNOSIS — H524 Presbyopia: Secondary | ICD-10-CM | POA: Diagnosis not present

## 2019-12-14 ENCOUNTER — Ambulatory Visit: Payer: Self-pay

## 2019-12-14 NOTE — Progress Notes (Signed)
Scheduled to complete physical 12/28/19 with Dr. Jimmye Norman.  AMD

## 2019-12-15 ENCOUNTER — Ambulatory Visit: Payer: Self-pay

## 2019-12-15 ENCOUNTER — Other Ambulatory Visit: Payer: Self-pay

## 2019-12-15 DIAGNOSIS — Z0000 Encounter for general adult medical examination without abnormal findings: Secondary | ICD-10-CM

## 2019-12-15 LAB — POCT URINALYSIS DIPSTICK
Bilirubin, UA: NEGATIVE
Blood, UA: POSITIVE
Glucose, UA: NEGATIVE
Ketones, UA: NEGATIVE
Leukocytes, UA: NEGATIVE
Nitrite, UA: NEGATIVE
Protein, UA: NEGATIVE
Spec Grav, UA: 1.025 (ref 1.010–1.025)
Urobilinogen, UA: 0.2 E.U./dL
pH, UA: 6 (ref 5.0–8.0)

## 2019-12-16 LAB — CMP12+LP+TP+TSH+6AC+CBC/D/PLT
ALT: 20 IU/L (ref 0–32)
AST: 24 IU/L (ref 0–40)
Albumin/Globulin Ratio: 1.9 (ref 1.2–2.2)
Albumin: 4.5 g/dL (ref 3.8–4.9)
Alkaline Phosphatase: 59 IU/L (ref 39–117)
BUN/Creatinine Ratio: 26 — ABNORMAL HIGH (ref 9–23)
BUN: 20 mg/dL (ref 6–24)
Basophils Absolute: 0.1 10*3/uL (ref 0.0–0.2)
Basos: 1 %
Bilirubin Total: 0.3 mg/dL (ref 0.0–1.2)
Calcium: 10.2 mg/dL (ref 8.7–10.2)
Chloride: 104 mmol/L (ref 96–106)
Chol/HDL Ratio: 5 ratio — ABNORMAL HIGH (ref 0.0–4.4)
Cholesterol, Total: 293 mg/dL — ABNORMAL HIGH (ref 100–199)
Creatinine, Ser: 0.78 mg/dL (ref 0.57–1.00)
EOS (ABSOLUTE): 0.3 10*3/uL (ref 0.0–0.4)
Eos: 5 %
Estimated CHD Risk: 1.3 times avg. — ABNORMAL HIGH (ref 0.0–1.0)
Free Thyroxine Index: 1.7 (ref 1.2–4.9)
GFR calc Af Amer: 100 mL/min/{1.73_m2} (ref >59)
GFR calc non Af Amer: 87 mL/min/{1.73_m2} (ref >59)
GGT: 18 IU/L (ref 0–60)
Globulin, Total: 2.4 g/dL (ref 1.5–4.5)
Glucose: 98 mg/dL (ref 65–99)
HDL: 59 mg/dL (ref >39)
Hematocrit: 41.9 % (ref 34.0–46.6)
Hemoglobin: 14.4 g/dL (ref 11.1–15.9)
Immature Grans (Abs): 0 10*3/uL (ref 0.0–0.1)
Immature Granulocytes: 0 %
Iron: 87 ug/dL (ref 27–159)
LDH: 182 IU/L (ref 119–226)
LDL Chol Calc (NIH): 203 mg/dL — ABNORMAL HIGH (ref 0–99)
Lymphocytes Absolute: 2.5 10*3/uL (ref 0.7–3.1)
Lymphs: 36 %
MCH: 32.4 pg (ref 26.6–33.0)
MCHC: 34.4 g/dL (ref 31.5–35.7)
MCV: 94 fL (ref 79–97)
Monocytes Absolute: 0.5 10*3/uL (ref 0.1–0.9)
Monocytes: 7 %
Neutrophils Absolute: 3.6 10*3/uL (ref 1.4–7.0)
Neutrophils: 51 %
Phosphorus: 3.9 mg/dL (ref 3.0–4.3)
Platelets: 259 10*3/uL (ref 150–450)
Potassium: 5 mmol/L (ref 3.5–5.2)
RBC: 4.45 x10E6/uL (ref 3.77–5.28)
RDW: 12.6 % (ref 11.7–15.4)
Sodium: 142 mmol/L (ref 134–144)
T3 Uptake Ratio: 25 % (ref 24–39)
T4, Total: 6.9 ug/dL (ref 4.5–12.0)
TSH: 1.54 u[IU]/mL (ref 0.450–4.500)
Total Protein: 6.9 g/dL (ref 6.0–8.5)
Triglycerides: 167 mg/dL — ABNORMAL HIGH (ref 0–149)
Uric Acid: 4.9 mg/dL (ref 3.0–7.2)
VLDL Cholesterol Cal: 31 mg/dL (ref 5–40)
WBC: 7 10*3/uL (ref 3.4–10.8)

## 2019-12-28 ENCOUNTER — Other Ambulatory Visit: Payer: Self-pay

## 2019-12-28 ENCOUNTER — Encounter: Payer: Self-pay | Admitting: Emergency Medicine

## 2019-12-28 ENCOUNTER — Ambulatory Visit: Payer: Self-pay | Admitting: Emergency Medicine

## 2019-12-28 VITALS — BP 148/90 | HR 70 | Temp 98.5°F | Resp 14 | Ht 65.0 in | Wt 165.0 lb

## 2019-12-28 DIAGNOSIS — Z0000 Encounter for general adult medical examination without abnormal findings: Secondary | ICD-10-CM

## 2019-12-28 MED ORDER — CYCLOBENZAPRINE HCL 5 MG PO TABS
5.0000 mg | ORAL_TABLET | Freq: Three times a day (TID) | ORAL | 1 refills | Status: DC | PRN
Start: 2019-12-28 — End: 2021-09-03

## 2019-12-28 MED ORDER — COENZYME Q10 100 MG PO CHEW: 100.0000 mg | CHEWABLE_TABLET | Freq: Every evening | ORAL | 3 refills | Status: AC

## 2019-12-28 MED ORDER — ATORVASTATIN CALCIUM 10 MG PO TABS: 10.0000 mg | ORAL_TABLET | Freq: Every day | ORAL | 3 refills | Status: DC

## 2019-12-28 NOTE — Addendum Note (Signed)
Addended by: Lenise Arena on: 12/28/2019 04:16 PM   Modules accepted: Orders

## 2019-12-28 NOTE — Progress Notes (Signed)
[facility] Provider Note       Time seen: 3:46 PM    I have reviewed the vital signs and the nursing notes.  HISTORY   Chief Complaint No chief complaint on file.   HPI Karen Willis is a 54 y.o. female with a history of anxiety, hyperlipidemia who presents today for annual physical examination.  Patient denies any complaints, has not had any recent illness.  Past Medical History:  Diagnosis Date  . Anxiety    SITUATIONAL  . Hypercholesteremia     Past Surgical History:  Procedure Laterality Date  . CESAREAN SECTION  03/25/1995   BREECH  . COLONOSCOPY    . MANDIBLE FRACTURE SURGERY     AGE 40  . TUBAL LIGATION     PJR  . WISDOM TOOTH EXTRACTION     20S    Allergies Macrobid [nitrofurantoin], Sulfa antibiotics, and Penicillins  Review of Systems Constitutional: Negative for fever. Cardiovascular: Negative for chest pain. Respiratory: Negative for shortness of breath. Gastrointestinal: Negative for abdominal pain, vomiting and diarrhea. Musculoskeletal: Negative for back pain. Skin: Negative for rash. Neurological: Negative for headaches, focal weakness or numbness.  All systems negative/normal/unremarkable except as stated in the HPI  ____________________________________________   PHYSICAL EXAM:  VITAL SIGNS: There were no vitals filed for this visit.  Constitutional: Alert and oriented. Well appearing and in no distress. Eyes: Conjunctivae are normal. Normal extraocular movements. ENT      Head: Normocephalic and atraumatic.      Nose: No congestion/rhinnorhea.      Mouth/Throat: Mucous membranes are moist.      Neck: No stridor. Cardiovascular: Normal rate, regular rhythm. No murmurs, rubs, or gallops. Respiratory: Normal respiratory effort without tachypnea nor retractions. Breath sounds are clear and equal bilaterally. No wheezes/rales/rhonchi. Gastrointestinal: Soft and nontender. Normal bowel  sounds Musculoskeletal: Nontender with normal range of motion in extremities. No lower extremity tenderness nor edema. Neurologic:  Normal speech and language. No gross focal neurologic deficits are appreciated.  Skin:  Skin is warm, dry and intact. No rash noted. Psychiatric: Speech and behavior are normal.  ____________________________________________  EKG: Interpreted by me.  Normal sinus rhythm, normal axis, normal intervals.  ____________________________________________   LABS (pertinent positives/negatives)  Recent Results (from the past 2160 hour(s))  CMP12+LP+TP+TSH+6AC+CBC/D/Plt     Status: Abnormal   Collection Time: 12/15/19  8:31 AM  Result Value Ref Range   Glucose 98 65 - 99 mg/dL   Uric Acid 4.9 3.0 - 7.2 mg/dL    Comment:            Therapeutic target for gout patients: <6.0   BUN 20 6 - 24 mg/dL   Creatinine, Ser 0.78 0.57 - 1.00 mg/dL   GFR calc non Af Amer 87 >59 mL/Willis/1.73   GFR calc Af Amer 100 >59 mL/Willis/1.73    Comment: **[facility] currently reports eGFR in compliance with the current**   recommendations of the [employer]. [facility] will   update reporting as new guidelines are published from the NKF-ASN   Task force.    BUN/Creatinine Ratio 26 (H) 9 - 23   Sodium 142 134 - 144 mmol/L   Potassium 5.0 3.5 - 5.2 mmol/L   Chloride 104 96 - 106 mmol/L   Calcium 10.2 8.7 - 10.2 mg/dL   Phosphorus 3.9 3.0 - 4.3 mg/dL   Total Protein 6.9 6.0 - 8.5 g/dL   Albumin 4.5 3.8 - 4.9 g/dL   Globulin, Total 2.4  1.5 - 4.5 g/dL   Albumin/Globulin Ratio 1.9 1.2 - 2.2   Bilirubin Total 0.3 0.0 - 1.2 mg/dL   Alkaline Phosphatase 59 39 - 117 IU/L   LDH 182 119 - 226 IU/L   AST 24 0 - 40 IU/L   ALT 20 0 - 32 IU/L   GGT 18 0 - 60 IU/L   Iron 87 27 - 159 ug/dL   Cholesterol, Total 293 (H) 100 - 199 mg/dL   Triglycerides 167 (H) 0 - 149 mg/dL   HDL 59 >39 mg/dL   VLDL Cholesterol Cal 31 5 - 40 mg/dL   LDL Chol Calc (NIH) 203 (H) 0 - 99 mg/dL   Chol/HDL  Ratio 5.0 (H) 0.0 - 4.4 ratio    Comment:                                   T. Chol/HDL Ratio                                             Men  Women                               1/2 Avg.Risk  3.4    3.3                                   Avg.Risk  5.0    4.4                                2X Avg.Risk  9.6    7.1                                3X Avg.Risk 23.4   11.0    Estimated CHD Risk 1.3 (H) 0.0 - 1.0 times avg.    Comment: The CHD Risk is based on the T. Chol/HDL ratio. Other factors affect CHD Risk such as hypertension, smoking, diabetes, severe obesity, and family history of premature CHD.    TSH 1.540 0.450 - 4.500 uIU/mL   T4, Total 6.9 4.5 - 12.0 ug/dL   T3 Uptake Ratio 25 24 - 39 %   Free Thyroxine Index 1.7 1.2 - 4.9   WBC 7.0 3.4 - 10.8 x10E3/uL   RBC 4.45 3.77 - 5.28 x10E6/uL   Hemoglobin 14.4 11.1 - 15.9 g/dL   Hematocrit 41.9 34.0 - 46.6 %   MCV 94 79 - 97 fL   MCH 32.4 26.6 - 33.0 pg   MCHC 34.4 31.5 - 35.7 g/dL   RDW 12.6 11.7 - 15.4 %   Platelets 259 150 - 450 x10E3/uL   Neutrophils 51 Not Estab. %   Lymphs 36 Not Estab. %   Monocytes 7 Not Estab. %   Eos 5 Not Estab. %   Basos 1 Not Estab. %   Neutrophils Absolute 3.6 1.4 - 7.0 x10E3/uL   Lymphocytes Absolute 2.5 0.7 - 3.1 x10E3/uL   Monocytes Absolute 0.5 0.1 - 0.9 x10E3/uL   EOS (ABSOLUTE) 0.3 0.0 - 0.4 x10E3/uL   Basophils Absolute  0.1 0.0 - 0.2 x10E3/uL   Immature Granulocytes 0 Not Estab. %   Immature Grans (Abs) 0.0 0.0 - 0.1 x10E3/uL  POCT urinalysis dipstick     Status: None   Collection Time: 12/15/19  9:22 AM  Result Value Ref Range   Color, UA yellow    Clarity, UA clear    Glucose, UA Negative Negative   Bilirubin, UA neg    Ketones, UA neg    Spec Grav, UA 1.025 1.010 - 1.025   Blood, UA pos     Comment: 10 Ery/ul   pH, UA 6.0 5.0 - 8.0   Protein, UA Negative Negative   Urobilinogen, UA 0.2 0.2 or 1.0 E.U./dL   Nitrite, UA neg    Leukocytes, UA Negative Negative   Appearance  clear    Odor     DIFFERENTIAL DIAGNOSIS  Annual physical exam, hyperlipidemia  ASSESSMENT AND PLAN  Annual physical exam, hyperlipidemia   Plan: The patient had presented for her annual exam. Patient's labs do indicate significant hyperlipidemia.  Patient is no longer taking co-Q10 as well as Lipitor.  We will start these back and see her back in 3 months.  Lenise Arena MD    Note: This note was generated in part or whole with voice recognition software. Voice recognition is usually quite accurate but there are transcription errors that can and very often do occur. I apologize for any typographical errors that were not detected and corrected.

## 2020-03-28 ENCOUNTER — Other Ambulatory Visit: Payer: Self-pay

## 2020-03-28 DIAGNOSIS — E785 Hyperlipidemia, unspecified: Secondary | ICD-10-CM

## 2020-03-29 LAB — LIPID PANEL
Chol/HDL Ratio: 4.8 ratio — ABNORMAL HIGH (ref 0.0–4.4)
Cholesterol, Total: 210 mg/dL — ABNORMAL HIGH (ref 100–199)
HDL: 44 mg/dL (ref >39)
LDL Chol Calc (NIH): 125 mg/dL — ABNORMAL HIGH (ref 0–99)
Triglycerides: 231 mg/dL — ABNORMAL HIGH (ref 0–149)
VLDL Cholesterol Cal: 41 mg/dL — ABNORMAL HIGH (ref 5–40)

## 2020-04-03 ENCOUNTER — Ambulatory Visit: Payer: Self-pay | Admitting: Emergency Medicine

## 2020-04-03 ENCOUNTER — Encounter: Payer: Self-pay | Admitting: Emergency Medicine

## 2020-04-03 ENCOUNTER — Other Ambulatory Visit: Payer: Self-pay

## 2020-04-03 VITALS — BP 164/100 | HR 84 | Temp 99.4°F | Resp 14 | Ht 65.0 in | Wt 159.0 lb

## 2020-04-03 DIAGNOSIS — E785 Hyperlipidemia, unspecified: Secondary | ICD-10-CM

## 2020-04-03 DIAGNOSIS — R030 Elevated blood-pressure reading, without diagnosis of hypertension: Secondary | ICD-10-CM

## 2020-04-03 MED ORDER — MELOXICAM 15 MG PO TABS: ORAL_TABLET | ORAL | 6 refills | Status: DC

## 2020-04-03 NOTE — Progress Notes (Signed)
3 month follow up due to elevated lipids. Pt would like her Meloxicam refilled.

## 2020-04-03 NOTE — Progress Notes (Signed)
Occupational Health Provider Note       Time seen: 3:24 PM    I have reviewed the vital signs and the nursing notes.  HISTORY   Chief Complaint Follow-up   HPI Karen Willis is a 54 y.o. female with a history of anxiety, hyperlipidemia who presents today for recheck of her lipid panel.  Patient had restarted her cholesterol medications and is here for recheck.  Patient feels fine, blood pressure was noted to be elevated on arrival.  Past Medical History:  Diagnosis Date  . Anxiety    SITUATIONAL  . Hypercholesteremia     Past Surgical History:  Procedure Laterality Date  . CESAREAN SECTION  03/25/1995   [facility]  . COLONOSCOPY    . MANDIBLE FRACTURE SURGERY     AGE 26  . TUBAL LIGATION     [facility]  . WISDOM TOOTH EXTRACTION     20S    Allergies Macrobid [nitrofurantoin], Sulfa antibiotics, and Penicillins   Review of Systems Constitutional: Negative for fever. Cardiovascular: Negative for chest pain. Respiratory: Negative for shortness of breath. Gastrointestinal: Negative for abdominal pain, vomiting and diarrhea. Musculoskeletal: Negative for back pain. Skin: Negative for rash. Neurological: Negative for headaches, focal weakness or numbness.  All systems negative/normal/unremarkable except as stated in the HPI  ____________________________________________   PHYSICAL EXAM:  VITAL SIGNS: Vitals:   04/03/20 1506  BP: (!) 164/100  Pulse: 84  Resp: 14  Temp: 99.4 F (37.4 C)  SpO2: 95%    Constitutional: Alert and oriented. Well appearing and in no distress. Eyes: Conjunctivae are normal. Normal extraocular movements. Cardiovascular: Normal rate, regular rhythm. No murmurs, rubs, or gallops. Respiratory: Normal respiratory effort without tachypnea nor retractions. Breath sounds are clear and equal bilaterally. No wheezes/rales/rhonchi. Gastrointestinal: Soft and nontender. Normal bowel sounds Musculoskeletal: Nontender with normal range of motion  in extremities. No lower extremity tenderness nor edema. Neurologic:  Normal speech and language. No gross focal neurologic deficits are appreciated.  Skin:  Skin is warm, dry and intact. No rash noted. ____________________________________________   LABS (pertinent positives/negatives)  Recent Results (from the past 2160 hour(s))  Lipid panel     Status: Abnormal   Collection Time: 03/28/20  8:20 AM  Result Value Ref Range   Cholesterol, Total 210 (H) 100 - 199 mg/dL   Triglycerides 231 (H) 0 - 149 mg/dL   HDL 44 >39 mg/dL   VLDL Cholesterol Cal 41 (H) 5 - 40 mg/dL   LDL Chol Calc (NIH) 125 (H) 0 - 99 mg/dL   Chol/HDL Ratio 4.8 (H) 0.0 - 4.4 ratio    Comment:                                   T. Chol/HDL Ratio                                             Men  Women                               1/2 Avg.Risk  3.4    3.3  Avg.Risk  5.0    4.4                                2X Avg.Risk  9.6    7.1                                3X Avg.Risk 23.4   11.0      ASSESSMENT AND PLAN  Reevaluation, hyperlipidemia, asymptomatic hypertension   Plan: The patient had presented for reevaluation. Patient's labs do show improvement in her cholesterol panel.  I will have her continue this current regimen.  Blood pressure is elevated but it is difficult to discern how extensive this problem is.  She is certainly asymptomatic.  I have given her a journal to document her blood pressure and to come back in a week for recheck.  Lenise Arena MD    Note: This note was generated in part or whole with voice recognition software. Voice recognition is usually quite accurate but there are transcription errors that can and very often do occur. I apologize for any typographical errors that were not detected and corrected.

## 2020-06-14 ENCOUNTER — Other Ambulatory Visit: Payer: Self-pay

## 2020-06-14 ENCOUNTER — Ambulatory Visit (INDEPENDENT_AMBULATORY_CARE_PROVIDER_SITE_OTHER): Payer: 59 | Admitting: Dermatology

## 2020-06-14 DIAGNOSIS — L814 Other melanin hyperpigmentation: Secondary | ICD-10-CM

## 2020-06-14 DIAGNOSIS — D485 Neoplasm of uncertain behavior of skin: Secondary | ICD-10-CM

## 2020-06-14 DIAGNOSIS — L738 Other specified follicular disorders: Secondary | ICD-10-CM

## 2020-06-14 DIAGNOSIS — L820 Inflamed seborrheic keratosis: Secondary | ICD-10-CM

## 2020-06-14 DIAGNOSIS — Z1283 Encounter for screening for malignant neoplasm of skin: Secondary | ICD-10-CM

## 2020-06-14 DIAGNOSIS — L720 Epidermal cyst: Secondary | ICD-10-CM | POA: Diagnosis not present

## 2020-06-14 DIAGNOSIS — L578 Other skin changes due to chronic exposure to nonionizing radiation: Secondary | ICD-10-CM

## 2020-06-14 DIAGNOSIS — D225 Melanocytic nevi of trunk: Secondary | ICD-10-CM

## 2020-06-14 DIAGNOSIS — D1800 Hemangioma unspecified site: Secondary | ICD-10-CM

## 2020-06-14 DIAGNOSIS — D229 Melanocytic nevi, unspecified: Secondary | ICD-10-CM

## 2020-06-14 DIAGNOSIS — L920 Granuloma annulare: Secondary | ICD-10-CM

## 2020-06-14 DIAGNOSIS — L821 Other seborrheic keratosis: Secondary | ICD-10-CM

## 2020-06-14 HISTORY — DX: Melanocytic nevi, unspecified: D22.9

## 2020-06-14 NOTE — Progress Notes (Signed)
Follow-Up Visit   Subjective  Karen Willis is a 54 y.o. female who presents for the following: Annual Exam. She has noticed a lesion on her L forehead that she touches/rubs frequently, and she would like it checked.  She has Granuloma Annulare that is persistent.  She wonders about treatment.  She wants her moles checked. The patient presents for Total-Body Skin Exam (TBSE) for skin cancer screening and mole check.  The following portions of the chart were reviewed this encounter and updated as appropriate:  Tobacco  Allergies  Meds  Problems  Med Hx  Surg Hx  Fam Hx     Review of Systems:  No other skin or systemic complaints except as noted in HPI or Assessment and Plan.  Objective  Well appearing patient in no apparent distress; mood and affect are within normal limits.  A full examination was performed including scalp, head, eyes, ears, nose, lips, neck, chest, axillae, abdomen, back, buttocks, bilateral upper extremities, bilateral lower extremities, hands, feet, fingers, toes, fingernails, and toenails. All findings within normal limits unless otherwise noted below.  Objective  Face: Smooth white papule(s).   Objective  Face: Small yellow papules with a central dell.   Objective  L forehead/hairline x 1: Erythematous keratotic or waxy stuck-on papule or plaque.   Objective  LUQA: 0.6 cm irregular brown macule   Objective  L ankle: Annulare shaped plaque  Images    Assessment & Plan  [place] Face Benign appearing, observe  Sebaceous hyperplasia of face - chronic Face Benign appearing, observe.  Inflamed seborrheic keratosis L forehead/hairline x 1  Destruction of lesion - L forehead/hairline x 1 Complexity: simple   Destruction method: cryotherapy   Informed consent: discussed and consent obtained   Timeout:  patient name, date of birth, surgical site, and procedure verified Lesion destroyed using liquid nitrogen: Yes   Region frozen until  ice ball extended beyond lesion: Yes   Outcome: patient tolerated procedure well with no complications   Post-procedure details: wound care instructions given    Neoplasm of uncertain behavior of skin LUQA  Epidermal / dermal shaving  Lesion diameter (cm):  0.6 Informed consent: discussed and consent obtained   Timeout: patient name, date of birth, surgical site, and procedure verified   Procedure prep:  Patient was prepped and draped in usual sterile fashion Prep type:  Isopropyl alcohol Anesthesia: the lesion was anesthetized in a standard fashion   Anesthetic:  1% lidocaine w/ epinephrine 1-100,000 buffered w/ 8.4% NaHCO3 Instrument used: flexible razor blade   Hemostasis achieved with: pressure, aluminum chloride and electrodesiccation   Outcome: patient tolerated procedure well   Post-procedure details: sterile dressing applied and wound care instructions given   Dressing type: bandage and petrolatum    Specimen 1 - Surgical pathology Differential Diagnosis: D48.5 r/o ISK vs dysplastic nevus  Check Margins: No 0.6 cm irregular brown macule   Granuloma annulare - chronic; persistent L ankle Biopsy proven - benign, but if bothersome consider ILK injections. Pt declines treatment today.  See photo.  Lentigines - Scattered tan macules - Discussed due to sun exposure - Benign, observe - Call for any changes  Seborrheic Keratoses - Stuck-on, waxy, tan-brown papules and plaques  - Discussed benign etiology and prognosis. - Observe - Call for any changes  Melanocytic Nevi - Tan-brown and/or pink-flesh-colored symmetric macules and papules - Benign appearing on exam today - Observation - Call clinic for new or changing moles - Recommend daily use of broad spectrum  spf 30+ sunscreen to sun-exposed areas.   Hemangiomas - Red papules - Discussed benign nature - Observe - Call for any changes  Actinic Damage - Chronic, secondary to cumulative UV/sun exposure -  diffuse scaly erythematous macules with underlying dyspigmentation - Recommend daily broad spectrum sunscreen SPF 30+ to sun-exposed areas, reapply every 2 hours as needed.  - Call for new or changing lesions.  Skin cancer screening performed today.  Return in about 1 year (around 06/14/2021) for TBSE.  Luther Redo, CMA, am acting as scribe for Sarina Ser, MD .  Documentation: I have reviewed the above documentation for accuracy and completeness, and I agree with the above.  Sarina Ser, MD

## 2020-06-14 NOTE — Patient Instructions (Signed)

## 2020-06-18 ENCOUNTER — Encounter: Payer: Self-pay | Admitting: Dermatology

## 2020-06-19 ENCOUNTER — Telehealth: Payer: Self-pay

## 2020-06-19 NOTE — Telephone Encounter (Signed)
Patient informed of results and surgery scheduled.  

## 2020-06-19 NOTE — Telephone Encounter (Signed)
-----   Message from Ralene Bathe, MD sent at 06/18/2020  5:59 PM EST ----- Diagnosis Skin , LUQA DYSPLASTIC JUNCTIONAL LENTIGINOUS NEVUS WITH SEVERE ATYPIA, INFLAMED, LATERAL MARGIN INVOLVED  Severe dysplastic Schedule surgery

## 2020-08-21 ENCOUNTER — Encounter: Payer: 59 | Admitting: Dermatology

## 2020-09-18 DIAGNOSIS — M47812 Spondylosis without myelopathy or radiculopathy, cervical region: Secondary | ICD-10-CM | POA: Diagnosis not present

## 2020-10-02 ENCOUNTER — Ambulatory Visit (INDEPENDENT_AMBULATORY_CARE_PROVIDER_SITE_OTHER): Payer: 59 | Admitting: Dermatology

## 2020-10-02 ENCOUNTER — Other Ambulatory Visit: Payer: Self-pay

## 2020-10-02 ENCOUNTER — Telehealth: Payer: Self-pay

## 2020-10-02 ENCOUNTER — Encounter: Payer: Self-pay | Admitting: Dermatology

## 2020-10-02 DIAGNOSIS — D239 Other benign neoplasm of skin, unspecified: Secondary | ICD-10-CM

## 2020-10-02 DIAGNOSIS — D235 Other benign neoplasm of skin of trunk: Secondary | ICD-10-CM

## 2020-10-02 DIAGNOSIS — L920 Granuloma annulare: Secondary | ICD-10-CM | POA: Diagnosis not present

## 2020-10-02 DIAGNOSIS — L578 Other skin changes due to chronic exposure to nonionizing radiation: Secondary | ICD-10-CM | POA: Diagnosis not present

## 2020-10-02 DIAGNOSIS — L988 Other specified disorders of the skin and subcutaneous tissue: Secondary | ICD-10-CM | POA: Diagnosis not present

## 2020-10-02 MED ORDER — MUPIROCIN 2 % EX OINT: 1.0000 "application " | TOPICAL_OINTMENT | Freq: Every day | CUTANEOUS | 1 refills | Status: DC

## 2020-10-02 MED ORDER — DAPSONE 7.5 % EX GEL
1.0000 | CUTANEOUS | 4 refills | Status: DC
Start: 2020-10-02 — End: 2021-06-17

## 2020-10-02 NOTE — Patient Instructions (Signed)

## 2020-10-02 NOTE — Telephone Encounter (Signed)
Pt doing fine after today's surgery./sh 

## 2020-10-02 NOTE — Progress Notes (Signed)
Follow-Up Visit   Subjective  Karen Willis is a 54 y.o. female who presents for the following: Severe dysplastic nevus bx proven (LUQA, pt presents for excision) and hx of granuloma annulare (L ankle, bx proven, no txt).  The following portions of the chart were reviewed this encounter and updated as appropriate:   Tobacco  Allergies  Meds  Problems  Med Hx  Surg Hx  Fam Hx     Review of Systems:  No other skin or systemic complaints except as noted in HPI or Assessment and Plan.  Objective  Well appearing patient in no apparent distress; mood and affect are within normal limits.  A focused examination was performed including abdomen. Relevant physical exam findings are noted in the Assessment and Plan.  Objective  LUQA: Pink bx site 1.0 x 0.8cm  Objective  Left Ankle: Pink plaques   Assessment & Plan  Dysplastic nevus LUQA Severe, bx proven Start Mupirocin oint qd to excision site with dressing changes  Skin excision - LUQA  Lesion length (cm):  1 Lesion width (cm):  0.8 Margin per side (cm):  0.2 Total excision diameter (cm):  1.4 Informed consent: discussed and consent obtained   Timeout: patient name, date of birth, surgical site, and procedure verified   Procedure prep:  Patient was prepped and draped in usual sterile fashion Prep type:  Isopropyl alcohol and povidone-iodine Anesthesia: the lesion was anesthetized in a standard fashion   Anesthetic:  1% lidocaine w/ epinephrine 1-100,000 buffered w/ 8.4% NaHCO3 (16cc) Instrument used: #15 blade   Hemostasis achieved with: pressure   Hemostasis achieved with comment:  Electrocautery Outcome: patient tolerated procedure well with no complications   Post-procedure details: sterile dressing applied and wound care instructions given   Dressing type: bandage and pressure dressing (Mupirocin)    Skin repair - LUQA Complexity:  Complex Final length (cm):  4.5 Reason for type of repair: reduce tension to  allow closure, reduce the risk of dehiscence, infection, and necrosis, reduce subcutaneous dead space and avoid a hematoma, allow closure of the large defect, preserve normal anatomy, preserve normal anatomical and functional relationships and enhance both functionality and cosmetic results   Undermining: area extensively undermined   Undermining comment:  Undermining Defect 2.0 cm Subcutaneous layers (deep stitches):  Suture size:  3-0 Suture type: Vicryl (polyglactin 910)   Subcutaneous suture technique: Inverted Dermal. Fine/surface layer approximation (top stitches):  Suture size:  3-0 Suture type: nylon   Stitches: simple running   Suture removal (days):  7 Hemostasis achieved with: pressure Outcome: patient tolerated procedure well with no complications   Post-procedure details: sterile dressing applied and wound care instructions given   Dressing type: bandage, pressure dressing and bacitracin (Mupirocin)    Specimen 1 - Surgical pathology Differential Diagnosis: D48.5 Bx proven Severe dysplastic nevus Check Margins: yes Pink bx site 1.0 x 0.8cm [id number]  Granuloma annulare Left Ankle Bx proven 12/13/2018 Start Dapsone 7.5% gel qd/bid aa L ankle  Dapsone 7.5 % GEL - Left Ankle  Actinic Damage - chronic, secondary to cumulative UV radiation exposure/sun exposure over time - diffuse scaly erythematous macules with underlying dyspigmentation - Recommend daily broad spectrum sunscreen SPF 30+ to sun-exposed areas, reapply every 2 hours as needed.  - Call for new or changing lesions.  Return in about 1 week (around 10/09/2020) for suture removal.  I, Othelia Pulling, RMA, am acting as scribe for Sarina Ser, MD .  Documentation: I have reviewed the above documentation for  accuracy and completeness, and I agree with the above.  Sarina Ser, MD

## 2020-10-09 ENCOUNTER — Encounter: Payer: Self-pay | Admitting: Dermatology

## 2020-10-09 ENCOUNTER — Ambulatory Visit (INDEPENDENT_AMBULATORY_CARE_PROVIDER_SITE_OTHER): Payer: 59 | Admitting: Dermatology

## 2020-10-09 ENCOUNTER — Other Ambulatory Visit: Payer: Self-pay

## 2020-10-09 DIAGNOSIS — Z86018 Personal history of other benign neoplasm: Secondary | ICD-10-CM

## 2020-10-09 NOTE — Progress Notes (Unsigned)
   Follow-Up Visit   Subjective  Karen Willis is a 55 y.o. female who presents for the following: post op/suture removal (Patient is here today for suture removal at excision site of a margins free severely dysplastic nevus ).  The following portions of the chart were reviewed this encounter and updated as appropriate:   Tobacco  Allergies  Meds  Problems  Med Hx  Surg Hx  Fam Hx     Review of Systems:  No other skin or systemic complaints except as noted in HPI or Assessment and Plan.  Objective  Well appearing patient in no apparent distress; mood and affect are within normal limits.  A focused examination was performed including the abdomen. Relevant physical exam findings are noted in the Assessment and Plan.  Objective  LQUA: Healing excision site   Assessment & Plan  History of dysplastic nevus LQUA  Encounter for Removal of Sutures - Incision site at the [facility] is clean, dry and intact - Wound cleansed, sutures removed, wound cleansed and steri strips applied.  - Discussed pathology results showing a margins free severely dysplastic nevus  - Patient advised to keep steri-strips dry until they fall off. - Scars remodel for a full year. - Once steri-strips fall off, patient can apply over-the-counter silicone scar cream each night to help with scar remodeling if desired. - Patient advised to call with any concerns or if they notice any new or changing lesions.   Return for appointment as scheduled - TBSE .  Luther Redo, CMA, am acting as scribe for Sarina Ser, MD .  Documentation: I have reviewed the above documentation for accuracy and completeness, and I agree with the above.  Sarina Ser, MD

## 2020-10-10 ENCOUNTER — Encounter: Payer: Self-pay | Admitting: Dermatology

## 2020-10-15 DIAGNOSIS — M542 Cervicalgia: Secondary | ICD-10-CM | POA: Diagnosis not present

## 2020-10-15 DIAGNOSIS — M47812 Spondylosis without myelopathy or radiculopathy, cervical region: Secondary | ICD-10-CM | POA: Diagnosis not present

## 2020-11-02 DIAGNOSIS — M47812 Spondylosis without myelopathy or radiculopathy, cervical region: Secondary | ICD-10-CM | POA: Diagnosis not present

## 2020-12-12 NOTE — Progress Notes (Deleted)
Pt scheduled to complete physical 12/21/20 with Estrella Myrtle

## 2020-12-13 DIAGNOSIS — Z0000 Encounter for general adult medical examination without abnormal findings: Secondary | ICD-10-CM

## 2020-12-17 ENCOUNTER — Other Ambulatory Visit: Payer: Self-pay

## 2020-12-17 ENCOUNTER — Ambulatory Visit: Payer: Self-pay

## 2020-12-17 DIAGNOSIS — Z0000 Encounter for general adult medical examination without abnormal findings: Secondary | ICD-10-CM

## 2020-12-17 LAB — POCT URINALYSIS DIPSTICK
Bilirubin, UA: NEGATIVE
Blood, UA: NEGATIVE
Glucose, UA: NEGATIVE
Ketones, UA: NEGATIVE
Leukocytes, UA: NEGATIVE
Nitrite, UA: NEGATIVE
Protein, UA: NEGATIVE
Spec Grav, UA: 1.015 (ref 1.010–1.025)
Urobilinogen, UA: 0.2 E.U./dL
pH, UA: 6 (ref 5.0–8.0)

## 2020-12-17 NOTE — Progress Notes (Signed)
Pt scheduled to complete physical 12/21/20 with Estrella Myrtle  CL,RMA

## 2020-12-18 LAB — CMP12+LP+TP+TSH+6AC+CBC/D/PLT
ALT: 26 IU/L (ref 0–32)
AST: 21 IU/L (ref 0–40)
Albumin/Globulin Ratio: 1.8 (ref 1.2–2.2)
Albumin: 4.8 g/dL (ref 3.8–4.9)
Alkaline Phosphatase: 63 IU/L (ref 44–121)
BUN/Creatinine Ratio: 17 (ref 9–23)
BUN: 14 mg/dL (ref 6–24)
Bilirubin Total: 0.4 mg/dL (ref 0.0–1.2)
Calcium: 9.4 mg/dL (ref 8.7–10.2)
Chloride: 99 mmol/L (ref 96–106)
Chol/HDL Ratio: 4.2 ratio (ref 0.0–4.4)
Cholesterol, Total: 275 mg/dL — ABNORMAL HIGH (ref 100–199)
Creatinine, Ser: 0.84 mg/dL (ref 0.57–1.00)
Estimated CHD Risk: 1 times avg. (ref 0.0–1.0)
Free Thyroxine Index: 1.7 (ref 1.2–4.9)
GGT: 36 IU/L (ref 0–60)
Globulin, Total: 2.6 g/dL (ref 1.5–4.5)
Glucose: 90 mg/dL (ref 65–99)
HDL: 65 mg/dL (ref >39)
Iron: 133 ug/dL (ref 27–159)
LDH: 206 IU/L (ref 119–226)
LDL Chol Calc (NIH): 155 mg/dL — ABNORMAL HIGH (ref 0–99)
Phosphorus: 2.6 mg/dL — ABNORMAL LOW (ref 3.0–4.3)
Potassium: 4.2 mmol/L (ref 3.5–5.2)
Sodium: 137 mmol/L (ref 134–144)
T3 Uptake Ratio: 24 % (ref 24–39)
T4, Total: 7 ug/dL (ref 4.5–12.0)
TSH: 2.81 u[IU]/mL (ref 0.450–4.500)
Total Protein: 7.4 g/dL (ref 6.0–8.5)
Triglycerides: 301 mg/dL — ABNORMAL HIGH (ref 0–149)
Uric Acid: 5 mg/dL (ref 3.0–7.2)
VLDL Cholesterol Cal: 55 mg/dL — ABNORMAL HIGH (ref 5–40)
eGFR: 83 mL/min/{1.73_m2} (ref >59)

## 2020-12-21 ENCOUNTER — Other Ambulatory Visit: Payer: Self-pay

## 2020-12-21 ENCOUNTER — Encounter: Payer: Self-pay | Admitting: Physician Assistant

## 2020-12-21 ENCOUNTER — Ambulatory Visit: Payer: Self-pay | Admitting: Physician Assistant

## 2020-12-21 VITALS — BP 208/111 | HR 66 | Temp 98.0°F | Resp 14 | Ht 65.0 in | Wt 162.0 lb

## 2020-12-21 DIAGNOSIS — Z0000 Encounter for general adult medical examination without abnormal findings: Secondary | ICD-10-CM

## 2020-12-21 DIAGNOSIS — I10 Essential (primary) hypertension: Secondary | ICD-10-CM

## 2020-12-21 MED ORDER — ATORVASTATIN CALCIUM 40 MG PO TABS: 40.0000 mg | ORAL_TABLET | Freq: Every day | ORAL | 3 refills | Status: DC

## 2020-12-21 MED ORDER — AMLODIPINE BESYLATE 5 MG PO TABS: 5.0000 mg | ORAL_TABLET | Freq: Every day | ORAL | 0 refills | Status: DC

## 2020-12-21 NOTE — Progress Notes (Signed)
Pt requesting refills on cyclobenzaprine 5mg  and meloxicam 15mg  and atorvastin 10mg . CL,RMA

## 2020-12-21 NOTE — Progress Notes (Signed)
   Subjective: Annual physical exam    Patient ID: Karen Willis, female    DOB: Apr 02, 1966, 55 y.o.   MRN: [id number]  HPI Patient presents for annual physical exam.  Patient noted patient blood pressure was high.  Patient also states she is concerned about her hyperlipidemia readings.  Patient was seen last year and placed on Lipitor 5 mg.  Had initial cholesterol reduced to 75.  Patient is now up to 93.  Patient triglycerides were initially 167, they are now 301.  LDL was 203 is now 155.  Patient VLDL was 31 41.  HDL 65.   Review of Systems    Hyperlipidemia Objective:   Physical Exam No acute distress.  Temperature is 98, pulse 67, respiration 14, BP is 196/105, patient 97% O2 sat on room air. HEENT is unremarkable.  Neck is supple for adenopathy or bruits.  Lungs are clear to auscultation.  Heart regular rate and rhythm.  Patient had 1 visit with PAC on EKG. Abdomen with negative HSM, normoactive bowel sounds, soft, nontender to palpation. No obvious deformity to the upper or lower extremities.  Patient had full and equal range of motion of the upper and lower extremities. No obvious cervical or lumbar spine deformity.  Patient had full and equal range of motion of the cervical lumbar spine. Cranial nerves II through XII grossly intact.  DTRs 2+ without clonus.      Well exam. Discussed hyperlipidemia and hypertension treatment with patient.  Patient will start on Lipitor 40 mg and atorvastatin at 40 mg.  Patient to follow-up in 3 months with repeat lipids.  Patient will follow-up 2 weeks for 3-day blood pressure check.  Return immediately if any side effects or discomfort taking medications.Marland Kitchen

## 2020-12-22 LAB — CBC WITH DIFFERENTIAL/PLATELET
Basophils Absolute: 0.1 10*3/uL (ref 0.0–0.2)
Basos: 1 %
EOS (ABSOLUTE): 0.2 10*3/uL (ref 0.0–0.4)
Eos: 2 %
Hematocrit: 41.6 % (ref 34.0–46.6)
Hemoglobin: 13.9 g/dL (ref 11.1–15.9)
Immature Grans (Abs): 0 10*3/uL (ref 0.0–0.1)
Immature Granulocytes: 0 %
Lymphocytes Absolute: 3.1 10*3/uL (ref 0.7–3.1)
Lymphs: 32 %
MCH: 32.1 pg (ref 26.6–33.0)
MCHC: 33.4 g/dL (ref 31.5–35.7)
MCV: 96 fL (ref 79–97)
Monocytes Absolute: 0.6 10*3/uL (ref 0.1–0.9)
Monocytes: 7 %
Neutrophils Absolute: 5.6 10*3/uL (ref 1.4–7.0)
Neutrophils: 58 %
Platelets: 259 10*3/uL (ref 150–450)
RBC: 4.33 x10E6/uL (ref 3.77–5.28)
RDW: 12.3 % (ref 11.7–15.4)
WBC: 9.6 10*3/uL (ref 3.4–10.8)

## 2021-01-03 ENCOUNTER — Other Ambulatory Visit: Payer: Self-pay | Admitting: Emergency Medicine

## 2021-03-15 ENCOUNTER — Other Ambulatory Visit: Payer: Self-pay | Admitting: Physician Assistant

## 2021-03-15 NOTE — Progress Notes (Deleted)
At previous annual visit, Karen Smith,PA-C Discussed hyperlipidemia and hypertension treatment with patient and will start on Lipitor 40 mg and atorvastatin at 40 mg.  Patient presents today to follow-up for 3 month follow up lipids panel also starting 3 day BP check. Burna Sis

## 2021-03-18 ENCOUNTER — Other Ambulatory Visit: Payer: Self-pay

## 2021-03-18 DIAGNOSIS — E785 Hyperlipidemia, unspecified: Secondary | ICD-10-CM

## 2021-03-20 ENCOUNTER — Other Ambulatory Visit: Payer: Self-pay

## 2021-03-20 VITALS — BP 140/90 | HR 67

## 2021-03-20 DIAGNOSIS — E785 Hyperlipidemia, unspecified: Secondary | ICD-10-CM

## 2021-03-20 NOTE — Progress Notes (Signed)
Pt presents today for 3 mnth lipid panel and BP check. CL,RMA

## 2021-03-21 ENCOUNTER — Other Ambulatory Visit: Payer: Self-pay

## 2021-03-21 DIAGNOSIS — I10 Essential (primary) hypertension: Secondary | ICD-10-CM

## 2021-03-21 LAB — LIPID PANEL
Chol/HDL Ratio: 3.1 ratio (ref 0.0–4.4)
Cholesterol, Total: 184 mg/dL (ref 100–199)
HDL: 59 mg/dL (ref >39)
LDL Chol Calc (NIH): 93 mg/dL (ref 0–99)
Triglycerides: 191 mg/dL — ABNORMAL HIGH (ref 0–149)
VLDL Cholesterol Cal: 32 mg/dL (ref 5–40)

## 2021-03-21 MED ORDER — AMLODIPINE BESYLATE 5 MG PO TABS: 5.0000 mg | ORAL_TABLET | Freq: Every day | ORAL | 0 refills | Status: DC

## 2021-03-25 ENCOUNTER — Other Ambulatory Visit: Payer: Self-pay | Admitting: Physician Assistant

## 2021-03-25 DIAGNOSIS — Z1231 Encounter for screening mammogram for malignant neoplasm of breast: Secondary | ICD-10-CM

## 2021-04-09 ENCOUNTER — Ambulatory Visit
Admission: RE | Admit: 2021-04-09 | Discharge: 2021-04-09 | Disposition: A | Discharge status: Home / Self Care | Payer: 59 | Source: Ambulatory Visit | Attending: Physician Assistant | Admitting: Physician Assistant

## 2021-04-09 ENCOUNTER — Other Ambulatory Visit: Payer: Self-pay

## 2021-04-09 DIAGNOSIS — Z1231 Encounter for screening mammogram for malignant neoplasm of breast: Secondary | ICD-10-CM | POA: Diagnosis not present

## 2021-06-17 ENCOUNTER — Other Ambulatory Visit: Payer: Self-pay

## 2021-06-17 ENCOUNTER — Ambulatory Visit (INDEPENDENT_AMBULATORY_CARE_PROVIDER_SITE_OTHER): Payer: 59 | Admitting: Dermatology

## 2021-06-17 DIAGNOSIS — Z1283 Encounter for screening for malignant neoplasm of skin: Secondary | ICD-10-CM | POA: Diagnosis not present

## 2021-06-17 DIAGNOSIS — L920 Granuloma annulare: Secondary | ICD-10-CM | POA: Diagnosis not present

## 2021-06-17 DIAGNOSIS — D225 Melanocytic nevi of trunk: Secondary | ICD-10-CM

## 2021-06-17 DIAGNOSIS — L578 Other skin changes due to chronic exposure to nonionizing radiation: Secondary | ICD-10-CM

## 2021-06-17 DIAGNOSIS — L853 Xerosis cutis: Secondary | ICD-10-CM | POA: Diagnosis not present

## 2021-06-17 DIAGNOSIS — Z86018 Personal history of other benign neoplasm: Secondary | ICD-10-CM

## 2021-06-17 DIAGNOSIS — L814 Other melanin hyperpigmentation: Secondary | ICD-10-CM | POA: Diagnosis not present

## 2021-06-17 DIAGNOSIS — D489 Neoplasm of uncertain behavior, unspecified: Secondary | ICD-10-CM

## 2021-06-17 DIAGNOSIS — D1801 Hemangioma of skin and subcutaneous tissue: Secondary | ICD-10-CM

## 2021-06-17 DIAGNOSIS — L821 Other seborrheic keratosis: Secondary | ICD-10-CM | POA: Diagnosis not present

## 2021-06-17 DIAGNOSIS — D239 Other benign neoplasm of skin, unspecified: Secondary | ICD-10-CM

## 2021-06-17 HISTORY — DX: Other benign neoplasm of skin, unspecified: D23.9

## 2021-06-17 MED ORDER — DAPSONE 7.5 % EX GEL: 1.0000 "application " | CUTANEOUS | 4 refills | Status: DC

## 2021-06-17 NOTE — Progress Notes (Signed)
Follow-Up Visit   Subjective  Karen Willis is a 55 y.o. female who presents for the following: Follow-up (Patient here today for full body exam. Patient reports no new concerns. ). Patient here for full body skin exam and skin cancer screening.  The following portions of the chart were reviewed this encounter and updated as appropriate:  Tobacco  Allergies  Meds  Problems  Med Hx  Surg Hx  Fam Hx     Review of Systems: No other skin or systemic complaints except as noted in HPI or Assessment and Plan.  Objective  Well appearing patient in no apparent distress; mood and affect are within normal limits.  A full examination was performed including scalp, head, eyes, ears, nose, lips, neck, chest, axillae, abdomen, back, buttocks, bilateral upper extremities, bilateral lower extremities, hands, feet, fingers, toes, fingernails, and toenails. All findings within normal limits unless otherwise noted below.  Mid Back Xerosis   left lateral abdomen near side 0.6 cm irregular brown macule        left foot / ankle Pink plaques       Assessment & Plan  Xerosis cutis Back Recommend mild soap and moisturizing cream 1-2 times daily.  Gentle skin care handout provided. CeraVe cream.  Neoplasm of uncertain behavior left lateral abdomen near side Epidermal / dermal shaving Lesion diameter (cm):  0.6 Informed consent: discussed and consent obtained   Timeout: patient name, date of birth, surgical site, and procedure verified   Procedure prep:  Patient was prepped and draped in usual sterile fashion Prep type:  Isopropyl alcohol Anesthesia: the lesion was anesthetized in a standard fashion   Anesthetic:  1% lidocaine w/ epinephrine 1-100,000 buffered w/ 8.4% NaHCO3 Instrument used: flexible razor blade   Hemostasis achieved with: pressure, aluminum chloride and electrodesiccation   Outcome: patient tolerated procedure well   Post-procedure details: sterile dressing  applied and wound care instructions given   Dressing type: bandage and petrolatum    Specimen 1 - Surgical pathology Differential Diagnosis: R/o dysplastic nevus  Check Margins: No R/o dysplastic nevus   Granuloma annulare left foot / ankle Chronic and persistent Bx proven 12/13/2018 See photo.  continue Dapsone 7.5 % gel qd bid aa left ankle   Dapsone 7.5 % GEL - left foot / ankle Apply 1 application topically as directed. Qd to bid aa L ankle  Skin cancer screening  Lentigines - Scattered tan macules - Due to sun exposure - Benign-appearing, observe - Recommend daily broad spectrum sunscreen SPF 30+ to sun-exposed areas, reapply every 2 hours as needed. - Call for any changes  Seborrheic Keratoses - Stuck-on, waxy, tan-brown papules and/or plaques  - Benign-appearing - Discussed benign etiology and prognosis. - Observe - Call for any changes  Melanocytic Nevi - Tan-brown and/or pink-flesh-colored symmetric macules and papules - Benign appearing on exam today - Observation - Call clinic for new or changing moles - Recommend daily use of broad spectrum spf 30+ sunscreen to sun-exposed areas.   Hemangiomas - Red papules - Discussed benign nature - Observe - Call for any changes  Actinic Damage - Chronic condition, secondary to cumulative UV/sun exposure - diffuse scaly erythematous macules with underlying dyspigmentation - Recommend daily broad spectrum sunscreen SPF 30+ to sun-exposed areas, reapply every 2 hours as needed.  - Staying in the shade or wearing long sleeves, sun glasses (UVA+UVB protection) and wide brim hats (4-inch brim around the entire circumference of the hat) are also recommended for sun protection.  -  Call for new or changing lesions.  History of Dysplastic Nevi - No evidence of recurrence today left upper quadrant excised 2022 - Recommend regular full body skin exams - Recommend daily broad spectrum sunscreen SPF 30+ to sun-exposed areas,  reapply every 2 hours as needed.  - Call if any new or changing lesions are noted between office visits  Skin cancer screening performed today.  Return for 1 year tbse. IRuthell Rummage, CMA, am acting as scribe for Sarina Ser, MD. Documentation: I have reviewed the above documentation for accuracy and completeness, and I agree with the above.  Sarina Ser, MD

## 2021-06-17 NOTE — Patient Instructions (Addendum)
Biopsy Wound Care Instructions  Leave the original bandage on for 24 hours if possible.  If the bandage becomes soaked or soiled before that time, it is OK to remove it and examine the wound.  A small amount of post-operative bleeding is normal.  If excessive bleeding occurs, remove the bandage, place gauze over the site and apply continuous pressure (no peeking) over the area for 30 minutes. If this does not work, please call our clinic as soon as possible or page your doctor if it is after hours.   Once a day, cleanse the wound with soap and water. It is fine to shower. If a thick crust develops you may use a Q-tip dipped into dilute hydrogen peroxide (mix 1:1 with water) to dissolve it.  Hydrogen peroxide can slow the healing process, so use it only as needed.    After washing, apply petroleum jelly (Vaseline) or an antibiotic ointment if your doctor prescribed one for you, followed by a bandage.    For best healing, the wound should be covered with a layer of ointment at all times. If you are not able to keep the area covered with a bandage to hold the ointment in place, this may mean re-applying the ointment several times a day.  Continue this wound care until the wound has healed and is no longer open.   Itching and mild discomfort is normal during the healing process. However, if you develop pain or severe itching, please call our office.   If you have any discomfort, you can take Tylenol (acetaminophen) or ibuprofen as directed on the bottle. (Please do not take these if you have an allergy to them or cannot take them for another reason).  Some redness, tenderness and white or yellow material in the wound is normal healing.  If the area becomes very sore and red, or develops a thick yellow-green material (pus), it may be infected; please notify us.    If you have stitches, return to clinic as directed to have the stitches removed. You will continue wound care for 2-3 days after the stitches  are removed.   Wound healing continues for up to one year following surgery. It is not unusual to experience pain in the scar from time to time during the interval.  If the pain becomes severe or the scar thickens, you should notify the office.    A slight amount of redness in a scar is expected for the first six months.  After six months, the redness will fade and the scar will soften and fade.  The color difference becomes less noticeable with time.  If there are any problems, return for a post-op surgery check at your earliest convenience.  To improve the appearance of the scar, you can use silicone scar gel, cream, or sheets (such as Mederma or Serica) every night for up to one year. These are available over the counter (without a prescription).  Please call our office at [phone] for any questions or concerns.   Gentle Skin Care Guide  1. Bathe no more than once a day.  2. Avoid bathing in hot water  3. Use a mild soap like Dove, Vanicream, Cetaphil, CeraVe. Can use Lever 2000 or Cetaphil antibacterial soap  4. Use soap only where you need it. On most days, use it under your arms, between your legs, and on your feet. Let the water rinse other areas unless visibly dirty.  5. When you get out of the bath/shower, use a  towel to gently blot your skin dry, don't rub it.  6. While your skin is still a little damp, apply a moisturizing cream such as Vanicream, CeraVe, Cetaphil, Eucerin, Sarna lotion or plain Vaseline Jelly. For hands apply Neutrogena [place] Hand Cream or Excipial Hand Cream.  7. Reapply moisturizer any time you start to itch or feel dry.  8. Sometimes using free and clear laundry detergents can be helpful. Fabric softener sheets should be avoided. Downy Free & Gentle liquid, or any liquid fabric softener that is free of dyes and perfumes, it acceptable to use  9. If your doctor has given you prescription creams you may apply moisturizers over them    Melanoma  ABCDEs  Melanoma is the most dangerous type of skin cancer, and is the leading cause of death from skin disease.  You are more likely to develop melanoma if you: Have light-colored skin, light-colored eyes, or red or blond hair Spend a lot of time in the sun Tan regularly, either outdoors or in a tanning bed Have had blistering sunburns, especially during childhood Have a close family member who has had a melanoma Have atypical moles or large birthmarks  Early detection of melanoma is key since treatment is typically straightforward and cure rates are extremely high if we catch it early.   The first sign of melanoma is often a change in a mole or a new dark spot.  The ABCDE system is a way of remembering the signs of melanoma.  A for asymmetry:  The two halves do not match. B for border:  The edges of the growth are irregular. C for color:  A mixture of colors are present instead of an even brown color. D for diameter:  Melanomas are usually (but not always) greater than 14mm - the size of a pencil eraser. E for evolution:  The spot keeps changing in size, shape, and color.  Please check your skin once per month between visits. You can use a small mirror in front and a large mirror behind you to keep an eye on the back side or your body.   If you see any new or changing lesions before your next follow-up, please call to schedule a visit.  Please continue daily skin protection including broad spectrum sunscreen SPF 30+ to sun-exposed areas, reapplying every 2 hours as needed when you're outdoors.   Staying in the shade or wearing long sleeves, sun glasses (UVA+UVB protection) and wide brim hats (4-inch brim around the entire circumference of the hat) are also recommended for sun protection.    If you have any questions or concerns for your doctor, please call our main line at [phone] and press option 4 to reach your doctor's medical assistant. If no one answers, please leave a  voicemail as directed and we will return your call as soon as possible. Messages left after 4 pm will be answered the following business day.   You may also send [place] a message via [place]. We typically respond to MyChart messages within 1-2 business days.  For prescription refills, please ask your pharmacy to contact our office. Our fax number is [phone].  If you have an urgent issue when the clinic is closed that cannot wait until the next business day, you can page your doctor at the number below.    Please note that while we do our best to be available for urgent issues outside of office hours, we are not available 24/7.   If you have  an urgent issue and are unable to reach [place], you may choose to seek medical care at your doctor's office, retail clinic, urgent care center, or emergency room.  If you have a medical emergency, please immediately call 911 or go to the emergency department.  Pager Numbers  - Dr. Nehemiah Massed: [phone]  - Dr. Laurence Ferrari: [phone]  - Dr. Nicole Kindred: [phone]  In the event of inclement weather, please call our main line at [phone] for an update on the status of any delays or closures.  Dermatology Medication Tips: Please keep the boxes that topical medications come in in order to help keep track of the instructions about where and how to use these. Pharmacies typically print the medication instructions only on the boxes and not directly on the medication tubes.   If your medication is too expensive, please contact our office at [phone] option 4 or send [place] a message through [place].   We are unable to tell what your co-pay for medications will be in advance as this is different depending on your insurance coverage. However, we may be able to find a substitute medication at lower cost or fill out paperwork to get insurance to cover a needed medication.   If a prior authorization is required to get your medication covered by your insurance  company, please allow [place] 1-2 business days to complete this process.  Drug prices often vary depending on where the prescription is filled and some pharmacies may offer cheaper prices.  The website www.goodrx.com contains coupons for medications through different pharmacies. The prices here do not account for what the cost may be with help from insurance (it may be cheaper with your insurance), but the website can give you the price if you did not use any insurance.  - You can print the associated coupon and take it with your prescription to the pharmacy.  - You may also stop by our office during regular business hours and pick up a GoodRx coupon card.  - If you need your prescription sent electronically to a different pharmacy, notify our office through [facility] or by phone at [phone] option 4.

## 2021-06-20 ENCOUNTER — Telehealth: Payer: Self-pay

## 2021-06-20 NOTE — Telephone Encounter (Signed)
Pt saw results in [place] and called to discuss. I went over results with pt and scheduled surgery. She had no concerns.

## 2021-06-20 NOTE — Telephone Encounter (Signed)
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

## 2021-06-21 ENCOUNTER — Other Ambulatory Visit: Payer: Self-pay | Admitting: Physician Assistant

## 2021-06-21 DIAGNOSIS — I10 Essential (primary) hypertension: Secondary | ICD-10-CM

## 2021-06-23 ENCOUNTER — Encounter: Payer: Self-pay | Admitting: Dermatology

## 2021-06-25 ENCOUNTER — Other Ambulatory Visit: Payer: Self-pay

## 2021-06-25 DIAGNOSIS — I10 Essential (primary) hypertension: Secondary | ICD-10-CM

## 2021-06-25 MED ORDER — AMLODIPINE BESYLATE 5 MG PO TABS: 5.0000 mg | ORAL_TABLET | Freq: Every day | ORAL | 0 refills | Status: DC

## 2021-07-23 ENCOUNTER — Ambulatory Visit (INDEPENDENT_AMBULATORY_CARE_PROVIDER_SITE_OTHER): Payer: 59 | Admitting: Dermatology

## 2021-07-23 ENCOUNTER — Other Ambulatory Visit: Payer: Self-pay

## 2021-07-23 ENCOUNTER — Telehealth: Payer: Self-pay

## 2021-07-23 ENCOUNTER — Encounter: Payer: Self-pay | Admitting: Dermatology

## 2021-07-23 DIAGNOSIS — D235 Other benign neoplasm of skin of trunk: Secondary | ICD-10-CM | POA: Diagnosis not present

## 2021-07-23 DIAGNOSIS — D239 Other benign neoplasm of skin, unspecified: Secondary | ICD-10-CM

## 2021-07-23 DIAGNOSIS — D225 Melanocytic nevi of trunk: Secondary | ICD-10-CM | POA: Diagnosis not present

## 2021-07-23 MED ORDER — MUPIROCIN 2 % EX OINT
1.0000 | TOPICAL_OINTMENT | Freq: Every day | CUTANEOUS | 0 refills | Status: DC
Start: 2021-07-23 — End: 2023-01-06

## 2021-07-23 NOTE — Patient Instructions (Signed)

## 2021-07-23 NOTE — Telephone Encounter (Signed)
Pt doing well after todays surgery./sh 

## 2021-07-23 NOTE — Progress Notes (Signed)
° °  Follow-Up Visit   Subjective  Karen Willis is a 55 y.o. female who presents for the following: Procedure (Biopsy proven moderate-severe dysplastic nevus of left lat abdomen near the side - Excise today).  The following portions of the chart were reviewed this encounter and updated as appropriate:   Tobacco   Allergies   Meds   Problems   Med Hx   Surg Hx   Fam Hx      Review of Systems:  No other skin or systemic complaints except as noted in HPI or Assessment and Plan.  Objective  Well appearing patient in no apparent distress; mood and affect are within normal limits.  A focused examination was performed including abdomen. Relevant physical exam findings are noted in the Assessment and Plan.  L lateral abdomen near the side Pink bx site 1.2 x 0.8cm   Assessment & Plan  Dysplastic nevus L lateral abdomen near the side  Moderate to severe, bx proven  Excised today  Start Mupirocin oint qd to excision site  Skin excision - L lateral abdomen near the side  Lesion length (cm):  1.2 Lesion width (cm):  0.8 Margin per side (cm):  0.2 Total excision diameter (cm):  1.6 Informed consent: discussed and consent obtained   Timeout: patient name, date of birth, surgical site, and procedure verified   Procedure prep:  Patient was prepped and draped in usual sterile fashion Prep type:  Isopropyl alcohol and povidone-iodine Anesthesia: the lesion was anesthetized in a standard fashion   Anesthetic:  1% lidocaine w/ epinephrine 1-100,000 buffered w/ 8.4% NaHCO3 (6.0cc lido w/ epi, 3.0cc bupivicaine, Total = 9.0cc) Instrument used: #15 blade   Hemostasis achieved with: pressure   Hemostasis achieved with comment:  Electrocautery Outcome: patient tolerated procedure well with no complications   Post-procedure details: sterile dressing applied and wound care instructions given   Dressing type: bandage and pressure dressing (Mupirocin)    Skin repair - L lateral abdomen near the  side Complexity:  Complex Final length (cm):  4 Reason for type of repair: reduce tension to allow closure, reduce the risk of dehiscence, infection, and necrosis, reduce subcutaneous dead space and avoid a hematoma, allow closure of the large defect, preserve normal anatomy, preserve normal anatomical and functional relationships and enhance both functionality and cosmetic results   Undermining: area extensively undermined   Undermining comment:  Undermining Defect 1.6cm Subcutaneous layers (deep stitches):  Suture size:  2-0 Suture type: Vicryl (polyglactin 910)   Subcutaneous suture technique: Inverted Dermal. Fine/surface layer approximation (top stitches):  Suture size:  3-0 Suture type: nylon   Stitches: simple running   Suture removal (days):  7 Hemostasis achieved with: pressure Outcome: patient tolerated procedure well with no complications   Post-procedure details: sterile dressing applied and wound care instructions given   Dressing type: bandage, pressure dressing and bacitracin (Mupirocin)    mupirocin ointment (BACTROBAN) 2 % - L lateral abdomen near the side Apply 1 application topically daily. Qd to excision site  Specimen 1 - Surgical pathology Differential Diagnosis: D48.5 Bx proven moderate to severe dysplastic nevus  Check Margins: yes Pink bx site 1.2 x 0.8cm [id number]   Return in about 1 week (around 07/30/2021) for suture removal, 64m TBSE.  I, Othelia Pulling, RMA, am acting as scribe for Sarina Ser, MD . Documentation: I have reviewed the above documentation for accuracy and completeness, and I agree with the above.  Sarina Ser, MD

## 2021-07-30 ENCOUNTER — Other Ambulatory Visit: Payer: Self-pay

## 2021-07-30 ENCOUNTER — Ambulatory Visit (INDEPENDENT_AMBULATORY_CARE_PROVIDER_SITE_OTHER): Payer: 59

## 2021-07-30 ENCOUNTER — Telehealth: Payer: Self-pay

## 2021-07-30 DIAGNOSIS — Z4802 Encounter for removal of sutures: Secondary | ICD-10-CM

## 2021-07-30 DIAGNOSIS — Z86018 Personal history of other benign neoplasm: Secondary | ICD-10-CM

## 2021-07-30 NOTE — Telephone Encounter (Signed)
Pt informed of results at suture removal appt. She had no concerns.

## 2021-07-30 NOTE — Patient Instructions (Signed)

## 2021-07-30 NOTE — Telephone Encounter (Signed)
-----   Message from Ralene Bathe, MD sent at 07/29/2021  8:02 PM EST ----- Diagnosis Skin (M), left lateral abdomen near the side EXCISION, PERSISTENT DYSPLASTIC NEVUS, MARGINS FREE  Severe dysplastic Margins free

## 2021-07-30 NOTE — Progress Notes (Signed)
° °  Follow-Up Visit   Subjective  Karen Willis is a 55 y.o. female who presents for the following: Suture / Staple Removal (7 day suture removal of in office excision of biopsy proven severe dysplastic nevus. Excision biopsy showing margins free. ).    The following portions of the chart were reviewed this encounter and updated as appropriate:       Objective  Well appearing patient in no apparent distress; mood and affect are within normal limits.    Left Flank Incision site is clean, dry and intact    Assessment & Plan  History of dysplastic nevus Left Flank  Encounter for Removal of Sutures - Incision site at the left lateral abdomen near the side is clean, dry and intact - Wound cleansed, sutures removed, wound cleansed and steri strips applied.  - Discussed pathology results showing margins free  - Patient advised to keep steri-strips dry until they fall off. - Scars remodel for a full year. - Once steri-strips fall off, patient can apply over-the-counter silicone scar cream each night to help with scar remodeling if desired. - Patient advised to call with any concerns or if they notice any new or changing lesions.    No follow-ups on file.  I, Harriett Sine, CMA, am acting as scribe for [employer], CMA.

## 2021-08-03 ENCOUNTER — Encounter: Payer: Self-pay | Admitting: Dermatology

## 2021-09-03 ENCOUNTER — Other Ambulatory Visit: Payer: Self-pay

## 2021-09-03 DIAGNOSIS — Z87440 Personal history of urinary (tract) infections: Secondary | ICD-10-CM | POA: Insufficient documentation

## 2021-09-03 DIAGNOSIS — G4700 Insomnia, unspecified: Secondary | ICD-10-CM | POA: Insufficient documentation

## 2021-09-03 DIAGNOSIS — R7989 Other specified abnormal findings of blood chemistry: Secondary | ICD-10-CM

## 2021-09-03 DIAGNOSIS — I10 Essential (primary) hypertension: Secondary | ICD-10-CM | POA: Insufficient documentation

## 2021-09-03 DIAGNOSIS — M25561 Pain in right knee: Secondary | ICD-10-CM | POA: Insufficient documentation

## 2021-09-03 DIAGNOSIS — G43901 Migraine, unspecified, not intractable, with status migrainosus: Secondary | ICD-10-CM

## 2021-09-03 DIAGNOSIS — G43909 Migraine, unspecified, not intractable, without status migrainosus: Secondary | ICD-10-CM | POA: Insufficient documentation

## 2021-09-03 DIAGNOSIS — M545 Low back pain, unspecified: Secondary | ICD-10-CM | POA: Insufficient documentation

## 2021-09-03 MED ORDER — CYCLOBENZAPRINE HCL 5 MG PO TABS: 5.0000 mg | ORAL_TABLET | Freq: Three times a day (TID) | ORAL | 0 refills | Status: AC | PRN

## 2021-09-03 NOTE — Telephone Encounter (Signed)
OK renew for one Rx only, interim provider.

## 2021-09-24 ENCOUNTER — Other Ambulatory Visit: Payer: Self-pay | Admitting: Physician Assistant

## 2021-09-24 DIAGNOSIS — I10 Essential (primary) hypertension: Secondary | ICD-10-CM

## 2021-10-14 ENCOUNTER — Other Ambulatory Visit: Payer: Self-pay

## 2021-10-14 NOTE — Telephone Encounter (Signed)
[facility] requesting [facility] refill for Norvasc 5 mg. ? ?Upon entering Rx refill info into Epic, a box for a pended Rx refill request sent electronically by pharmacy to [facility] ED opened up. ? ?Re-routed the pended Rx refill request to ?Randel Pigg, PA-C. ? ?AMD ?

## 2021-10-31 ENCOUNTER — Other Ambulatory Visit: Payer: Self-pay

## 2021-10-31 DIAGNOSIS — I10 Essential (primary) hypertension: Secondary | ICD-10-CM

## 2021-10-31 MED ORDER — AMLODIPINE BESYLATE 5 MG PO TABS: 5.0000 mg | ORAL_TABLET | Freq: Every day | ORAL | 3 refills | Status: DC

## 2021-11-05 ENCOUNTER — Ambulatory Visit: Payer: Self-pay

## 2021-11-05 DIAGNOSIS — Z0000 Encounter for general adult medical examination without abnormal findings: Secondary | ICD-10-CM

## 2021-11-05 LAB — POCT URINALYSIS DIPSTICK
Bilirubin, UA: NEGATIVE
Blood, UA: POSITIVE
Glucose, UA: NEGATIVE
Ketones, UA: NEGATIVE
Leukocytes, UA: NEGATIVE
Nitrite, UA: NEGATIVE
Protein, UA: NEGATIVE
Spec Grav, UA: 1.015 (ref 1.010–1.025)
Urobilinogen, UA: 0.2 E.U./dL
pH, UA: 7 (ref 5.0–8.0)

## 2021-11-06 LAB — CMP12+LP+TP+TSH+6AC+CBC/D/PLT
ALT: 30 IU/L (ref 0–32)
AST: 27 IU/L (ref 0–40)
Albumin/Globulin Ratio: 1.9 (ref 1.2–2.2)
Albumin: 4.5 g/dL (ref 3.8–4.9)
Alkaline Phosphatase: 77 IU/L (ref 44–121)
BUN/Creatinine Ratio: 18 (ref 9–23)
BUN: 13 mg/dL (ref 6–24)
Basophils Absolute: 0.1 10*3/uL (ref 0.0–0.2)
Basos: 1 %
Bilirubin Total: 0.5 mg/dL (ref 0.0–1.2)
Calcium: 9.3 mg/dL (ref 8.7–10.2)
Chloride: 98 mmol/L (ref 96–106)
Chol/HDL Ratio: 4.1 ratio (ref 0.0–4.4)
Cholesterol, Total: 236 mg/dL — ABNORMAL HIGH (ref 100–199)
Creatinine, Ser: 0.73 mg/dL (ref 0.57–1.00)
EOS (ABSOLUTE): 0.3 10*3/uL (ref 0.0–0.4)
Eos: 4 %
Estimated CHD Risk: 0.9 times avg. (ref 0.0–1.0)
Free Thyroxine Index: 1.8 (ref 1.2–4.9)
GGT: 39 IU/L (ref 0–60)
Globulin, Total: 2.4 g/dL (ref 1.5–4.5)
Glucose: 93 mg/dL (ref 70–99)
HDL: 58 mg/dL (ref >39)
Hematocrit: 43.3 % (ref 34.0–46.6)
Hemoglobin: 14.4 g/dL (ref 11.1–15.9)
Immature Grans (Abs): 0 10*3/uL (ref 0.0–0.1)
Immature Granulocytes: 0 %
Iron: 126 ug/dL (ref 27–159)
LDH: 179 IU/L (ref 119–226)
LDL Chol Calc (NIH): 118 mg/dL — ABNORMAL HIGH (ref 0–99)
Lymphocytes Absolute: 2.8 10*3/uL (ref 0.7–3.1)
Lymphs: 36 %
MCH: 31.2 pg (ref 26.6–33.0)
MCHC: 33.3 g/dL (ref 31.5–35.7)
MCV: 94 fL (ref 79–97)
Monocytes Absolute: 0.5 10*3/uL (ref 0.1–0.9)
Monocytes: 6 %
Neutrophils Absolute: 4.1 10*3/uL (ref 1.4–7.0)
Neutrophils: 53 %
Phosphorus: 3.4 mg/dL (ref 3.0–4.3)
Platelets: 279 10*3/uL (ref 150–450)
Potassium: 4.6 mmol/L (ref 3.5–5.2)
RBC: 4.61 x10E6/uL (ref 3.77–5.28)
RDW: 12.8 % (ref 11.7–15.4)
Sodium: 137 mmol/L (ref 134–144)
T3 Uptake Ratio: 26 % (ref 24–39)
T4, Total: 7.1 ug/dL (ref 4.5–12.0)
TSH: 2.05 u[IU]/mL (ref 0.450–4.500)
Total Protein: 6.9 g/dL (ref 6.0–8.5)
Triglycerides: 345 mg/dL — ABNORMAL HIGH (ref 0–149)
Uric Acid: 4.8 mg/dL (ref 3.0–7.2)
VLDL Cholesterol Cal: 60 mg/dL — ABNORMAL HIGH (ref 5–40)
WBC: 7.8 10*3/uL (ref 3.4–10.8)
eGFR: 97 mL/min/{1.73_m2} (ref >59)

## 2021-11-12 ENCOUNTER — Encounter: Payer: 59 | Admitting: Physician Assistant

## 2021-11-13 ENCOUNTER — Encounter: Payer: Self-pay | Admitting: Physician Assistant

## 2021-11-14 ENCOUNTER — Encounter: Payer: Self-pay | Admitting: Physician Assistant

## 2021-11-14 ENCOUNTER — Ambulatory Visit: Payer: Self-pay | Admitting: Physician Assistant

## 2021-11-14 VITALS — BP 142/89 | HR 73 | Temp 97.8°F | Resp 14 | Ht 65.0 in | Wt 163.0 lb

## 2021-11-14 DIAGNOSIS — E785 Hyperlipidemia, unspecified: Secondary | ICD-10-CM

## 2021-11-14 DIAGNOSIS — Z0000 Encounter for general adult medical examination without abnormal findings: Secondary | ICD-10-CM

## 2021-11-14 MED ORDER — ROSUVASTATIN CALCIUM 40 MG PO TABS: 40.0000 mg | ORAL_TABLET | Freq: Every day | ORAL | 3 refills | Status: DC

## 2021-11-14 NOTE — Progress Notes (Signed)
? ? ?____________________________________________ ? ? None  ?  (approximate) ? ?I have reviewed the triage vital signs and the nursing notes. ? ? ?HISTORY ? ?Chief Complaint ?Annual Exam ? ? ?HPI ?Karen Willis is a 56 y.o. female presents for annual physical exam.  Patient immediately stated that she has become noncompliant with her statin medication and does not increase in alcohol intake.  Patient has noticed that her cholesterol and triglycerides has increased from the last visit.  Patient states she will immediately start modifying lifestyle changes. ?   ? ?  ? ? ?Past Medical History:  ?Diagnosis Date  ? Anxiety   ? SITUATIONAL  ? Dysplastic Nevus 06/14/2020  ? LUQA, Severe atypia, excised 10/02/20  ? Dysplastic nevus 06/17/2021  ? mod-sev, left lateral abdomen near side, excised 07/23/21  ? Hypercholesteremia   ? ? ?Patient Active Problem List  ? Diagnosis Date Noted  ? Migraine headache 09/03/2021  ? High serum low density lipoprotein (LDL) cholesterol 09/03/2021  ? Personal history of urinary (tract) infections 09/03/2021  ? Right knee pain 09/03/2021  ? Essential hypertension 09/03/2021  ? Insomnia 09/03/2021  ? Lumbago 09/03/2021  ? Tobacco use 10/14/2016  ? ? ?Past Surgical History:  ?Procedure Laterality Date  ? CESAREAN SECTION  03/25/1995  ? BREECH  ? COLONOSCOPY    ? MANDIBLE FRACTURE SURGERY    ? AGE 33  ? TUBAL LIGATION    ? [facility]  ? WISDOM TOOTH EXTRACTION    ? 20S  ? ? ?Prior to Admission medications   ?Medication Sig Start Date End Date Taking? Authorizing Provider  ?amLODipine (NORVASC) 5 MG tablet Take 1 tablet (5 mg total) by mouth daily. 10/31/21  Yes Sable Feil, PA-C  ?atorvastatin (LIPITOR) 40 MG tablet Take 1 tablet (40 mg total) by mouth daily. 12/21/20  Yes Sable Feil, PA-C  ?Coenzyme Q10 100 MG CHEW Chew 1 tablet (100 mg total) by mouth Nightly. 12/28/19  Yes Earleen Newport, MD  ?cyclobenzaprine (FLEXERIL) 5 MG tablet Take 1 tablet (5 mg total) by mouth 3 (three) times  daily as needed (migraine phenomenon, muscle relaxant). 09/03/21  Yes Jan Fireman, PA-C  ?Dapsone 7.5 % GEL Apply 1 application topically as directed. Qd to bid aa L ankle 06/17/21  Yes Ralene Bathe, MD  ?fluticasone [facility]) 50 MCG/ACT nasal spray USE 1 SPRAY INTO EACH NOSTRIL TWICE A DAY 02/03/18  Yes [provider]  ?meloxicam (MOBIC) 15 MG tablet TAKE 1 TABLET BY MOUTH EVERY MORNING AS NEEDED 04/03/20  Yes Earleen Newport, MD  ?mupirocin ointment (BACTROBAN) 2 % Apply 1 application topically daily. Qd to excision site 07/23/21  Yes Ralene Bathe, MD  ?traZODone (DESYREL) 50 MG tablet Take by mouth. 09/17/17  Yes [provider]  ? ? ?Allergies ?Macrobid [nitrofurantoin], Sulfa antibiotics, Penicillins, and Ampicillin ? ?Family History  ?Problem Relation Age of Onset  ? Hyperlipidemia Mother   ? Heart disease Father   ? Stroke Father   ? Diabetes Father   ? Hyperlipidemia Sister   ? Breast cancer Neg Hx   ? ? ?Social History ?Social History  ? ?Tobacco Use  ? Smoking status: Every Day  ?  Packs/day: 0.50  ?  Types: Cigarettes  ? Smokeless tobacco: Never  ?Vaping Use  ? Vaping Use: Never used  ?Substance Use Topics  ? Alcohol use: Yes  ?  Comment: OCC  ? Drug use: No  ? ? ?Review of Systems ?Constitutional: No fever/chills ?  Eyes: No visual changes. ?ENT: No sore throat. ?Cardiovascular: Denies chest pain. ?Respiratory: Denies shortness of breath. ?Gastrointestinal: No abdominal pain.  No nausea, no vomiting.  No diarrhea.  No constipation. ?Genitourinary: Negative for dysuria. ?Musculoskeletal: Negative for back pain. ?Skin: Negative for rash. ?Neurological: Positive for headaches, but denies focal weakness or numbness. ?Psychiatric: Anxiety ?Endocrine: Hyperlipidemia and hypertension. ?Hematological/Lymphatic:  ?Allergic/Immunilogical: Macrobid, penicillin, and sulfa antibiotics. ?____________________________________________ ? ? ?PHYSICAL EXAM: ? ?VITAL SIGNS: ?Constitutional: Alert  and oriented. Well appearing and in no acute distress. ?Eyes: Conjunctivae are normal. PERRL. EOMI. ?Head: Atraumatic. ?Nose: No congestion/rhinnorhea. ?Mouth/Throat: Mucous membranes are moist.  Oropharynx non-erythematous. ?Neck: No stridor.  No cervical spine tenderness to palpation. ?Hematological/Lymphatic/Immunilogical: No cervical lymphadenopathy. ?Cardiovascular: Normal rate, regular rhythm. Grossly normal heart sounds.  Good peripheral circulation. ?Respiratory: Normal respiratory effort.  No retractions. Lungs CTAB. ?Gastrointestinal: Soft and nontender. No distention. No abdominal bruits. No CVA tenderness. ?Genitourinary: Deferred ?Musculoskeletal: No lower extremity tenderness nor edema.  No joint effusions. ?Neurologic:  Normal speech and language. No gross focal neurologic deficits are appreciated. No gait instability. ?Skin:  Skin is warm, dry and intact. No rash noted. ?Psychiatric: Mood and affect are normal. Speech and behavior are normal. ? ?____________________________________________ ?  ?LABS ? ?___ ?      ?Component Ref Range & Units 9 d ago 11 mo ago 1 yr ago  ?Color, UA  Light Yellow  yellow  yellow   ?Clarity, UA  Clear  clear  clear   ?Glucose, UA Negative Negative  Negative  Negative   ?Bilirubin, UA  Negative  negative  neg   ?Ketones, UA  Negative  negative  neg   ?Spec Grav, UA 1.010 - 1.025 1.015  1.015  1.025   ?Blood, UA  Positive  negative  pos CM   ?Comment: 1+ (No longer has a menstrual cycle)  ?pH, UA 5.0 - 8.0 7.0  6.0  6.0   ?Protein, UA Negative Negative  Negative  Negative   ?Urobilinogen, UA 0.2 or 1.0 E.U./dL 0.2  0.2  0.2   ?Nitrite, UA  Negative  negative  neg   ?Leukocytes, UA Negative Negative  Negative  Negative   ?Appearance   light  clear   ?Odor        ?  ? ?  ?  ?Specimen Collected: 11/05/21 09:15 Last Resulted: 11/05/21 09:15  ?  ?  Lab Flowsheet   ? Order Details   ? View Encounter   ? Lab and Collection Details   ? Routing   ? Result History    ?View Encounter  Conversation    ?  ?CM=Additional comments    ?  ?Result Care Coordination ? ? ?Patient Communication ? ? Add Comments   Seen Back to Top  ?  ?  ? ?Other Results from 11/05/2021 ? ? Contains abnormal data CMP12+LP+TP+TSH+6AC+CBC/D/Plt ?Order: [id number] ?Status: Final result    ?Visible to patient: Yes (seen)    ?Next appt: 01/22/2022 at 03:45 PM in Dermatology Sarina Ser, MD)    ?Dx: Routine adult health maintenance    ?0 Result Notes ?         ?Component Ref Range & Units 9 d ago ?(11/05/21) 7 mo ago ?(03/20/21) 10 mo ago ?(12/21/20) 11 mo ago ?(12/17/20) 1 yr ago ?(03/28/20) 1 yr ago ?(12/15/19)  ?Glucose 70 - 99 mg/dL 93    90 R   98 R   ?Uric Acid 3.0 - 7.2 mg/dL 4.8    5.0 CM  4.9 CM   ?Comment:            Therapeutic target for gout patients: <6.0  ?BUN 6 - 24 mg/dL _0 ?Creatinine, Ser 0.57 - 1.00 mg/dL 0.73    0.84   0.78   ?eGFR >59 mL/min/1.73 97    83     ?BUN/Creatinine Ratio 9 - _1 High    ?Sodium 134 - 144 mmol/L 137    137   142   ?Potassium 3.5 - 5.2 mmol/L 4.6    4.2   5.0   ?Chloride 96 - 106 mmol/L 98    99   104   ?Calcium 8.7 - 10.2 mg/dL 9.3    9.4   10.2   ?Phosphorus 3.0 - 4.3 mg/dL 3.4    2.6 Low    3.9   ?Total Protein 6.0 - 8.5 g/dL 6.9    7.4   6.9   ?Albumin 3.8 - 4.9 g/dL 4.5    4.8   4.5   ?Globulin, Total 1.5 - 4.5 g/dL 2.4    2.6   2.4   ?Albumin/Globulin Ratio 1.2 - 2.2 1.9    1.8   1.9   ?Bilirubin Total 0.0 - 1.2 mg/dL 0.5    0.4   0.3   ?Alkaline Phosphatase 44 - 121 IU/L 77    63   59 R   ?LDH 119 - 226 IU/L 179    206   182   ?AST 0 - 40 IU/L _2 ?ALT 0 - 32 IU/L _3 ?GGT 0 - 60 IU/L 39    36   18   ?Iron 27 - 159 ug/dL 126    133   87   ?Cholesterol, Total 100 - 199 mg/dL 236 High   184   275 High   210 High   293 High    ?Triglycerides 0 - 149 mg/dL 345 High   191 High    301 High   231 High   167 High    ?HDL >39 mg/dL 58  59   65  44  59   ?VLDL Cholesterol Cal 5 - 40 mg/dL 60 High   32   55 High   41 High   31   ?LDL Chol Calc  (NIH) 0 - 99 mg/dL 118 High   93   155 High   125 High   203 High    ?Chol/HDL Ratio 0.0 - 4.4 ratio 4.1  3.1 CM   4.2 CM  4.8 High  CM  5.0 High  CM   ?Comment:                                   T.

## 2021-11-14 NOTE — Progress Notes (Signed)
Pt presents today to complete physical. Pt denies any issues or concerns but is aware of her cholesterol. ?

## 2022-01-02 ENCOUNTER — Other Ambulatory Visit: Payer: Self-pay | Admitting: Physician Assistant

## 2022-01-12 ENCOUNTER — Other Ambulatory Visit: Payer: Self-pay | Admitting: Physician Assistant

## 2022-01-22 ENCOUNTER — Ambulatory Visit (INDEPENDENT_AMBULATORY_CARE_PROVIDER_SITE_OTHER): Payer: 59 | Admitting: Dermatology

## 2022-01-22 DIAGNOSIS — L820 Inflamed seborrheic keratosis: Secondary | ICD-10-CM | POA: Diagnosis not present

## 2022-01-22 DIAGNOSIS — Z86018 Personal history of other benign neoplasm: Secondary | ICD-10-CM

## 2022-01-22 DIAGNOSIS — Z1283 Encounter for screening for malignant neoplasm of skin: Secondary | ICD-10-CM | POA: Diagnosis not present

## 2022-01-22 DIAGNOSIS — D1800 Hemangioma unspecified site: Secondary | ICD-10-CM

## 2022-01-22 DIAGNOSIS — L578 Other skin changes due to chronic exposure to nonionizing radiation: Secondary | ICD-10-CM | POA: Diagnosis not present

## 2022-01-22 DIAGNOSIS — L814 Other melanin hyperpigmentation: Secondary | ICD-10-CM

## 2022-01-22 DIAGNOSIS — D229 Melanocytic nevi, unspecified: Secondary | ICD-10-CM

## 2022-01-22 DIAGNOSIS — L920 Granuloma annulare: Secondary | ICD-10-CM

## 2022-01-22 DIAGNOSIS — L821 Other seborrheic keratosis: Secondary | ICD-10-CM

## 2022-01-22 NOTE — Progress Notes (Unsigned)
Follow-Up Visit   Subjective  Karen Willis is a 56 y.o. female who presents for the following: Annual Exam (Hx dysplastic nevi - scaly patch on R upper lip that patient is concerned about and would like checked). The patient presents for Total-Body Skin Exam (TBSE) for skin cancer screening and mole check.  The patient has spots, moles and lesions to be evaluated, some may be new or changing.   The following portions of the chart were reviewed this encounter and updated as appropriate:       Review of Systems:  No other skin or systemic complaints except as noted in HPI or Assessment and Plan.  Objective  Well appearing patient in no apparent distress; mood and affect are within normal limits.  A full examination was performed including scalp, head, eyes, ears, nose, lips, neck, chest, axillae, abdomen, back, buttocks, bilateral upper extremities, bilateral lower extremities, hands, feet, fingers, toes, fingernails, and toenails. All findings within normal limits unless otherwise noted below.  R upper lip Erythematous stuck-on, waxy papule or plaque    Assessment & Plan  Inflamed seborrheic keratosis R upper lip  Destruction of lesion - R upper lip Complexity: simple   Destruction method: cryotherapy   Informed consent: discussed and consent obtained   Timeout:  patient name, date of birth, surgical site, and procedure verified Lesion destroyed using liquid nitrogen: Yes   Region frozen until ice ball extended beyond lesion: Yes   Outcome: patient tolerated procedure well with no complications   Post-procedure details: wound care instructions given    Granuloma annulare L foot/ankle  Granuloma annulare is a chronic benign skin condition characterized by pink smooth bumps or ring-like plaques most commonly appearing over the joints and the backs of the hands/feet. Its cause is not known, and most episodes of granuloma annulare clear up after a few years, with or without  treatment.  Chronic and persistent Bx proven 12/13/2018 See photo.  continue Dapsone 7.5 % gel qd bid aa left ankle    Related Medications Dapsone 7.5 % GEL Apply 1 application topically as directed. Qd to bid aa L ankle   Lentigines - Scattered tan macules - Due to sun exposure - Benign-appearing, observe - Recommend daily broad spectrum sunscreen SPF 30+ to sun-exposed areas, reapply every 2 hours as needed. - Call for any changes  Seborrheic Keratoses - Stuck-on, waxy, tan-brown papules and/or plaques  - Benign-appearing - Discussed benign etiology and prognosis. - Observe - Call for any changes  Melanocytic Nevi - Tan-brown and/or pink-flesh-colored symmetric macules and papules - Benign appearing on exam today - Observation - Call clinic for new or changing moles - Recommend daily use of broad spectrum spf 30+ sunscreen to sun-exposed areas.   Hemangiomas - Red papules - Discussed benign nature - Observe - Call for any changes  Actinic Damage - Chronic condition, secondary to cumulative UV/sun exposure - diffuse scaly erythematous macules with underlying dyspigmentation - Recommend daily broad spectrum sunscreen SPF 30+ to sun-exposed areas, reapply every 2 hours as needed.  - Staying in the shade or wearing long sleeves, sun glasses (UVA+UVB protection) and wide brim hats (4-inch brim around the entire circumference of the hat) are also recommended for sun protection.  - Call for new or changing lesions.  History of Dysplastic Nevi - No evidence of recurrence today - Recommend regular full body skin exams - Recommend daily broad spectrum sunscreen SPF 30+ to sun-exposed areas, reapply every 2 hours as needed.  -  Call if any new or changing lesions are noted between office visits  Skin cancer screening performed today.  Return in about 1 year (around 01/23/2023) for TBSE.  Luther Redo, CMA, am acting as scribe for Sarina Ser, MD .

## 2022-01-22 NOTE — Patient Instructions (Signed)
Due to recent changes in healthcare laws, you may see results of your pathology and/or laboratory studies on MyChart before the doctors have had a chance to review them. We understand that in some cases there may be results that are confusing or concerning to you. Please understand that not all results are received at the same time and often the doctors may need to interpret multiple results in order to provide you with the best plan of care or course of treatment. Therefore, we ask that you please give us 2 business days to thoroughly review all your results before contacting the office for clarification. Should we see a critical lab result, you will be contacted sooner.   If You Need Anything After Your Visit  If you have any questions or concerns for your doctor, please call our main line at [phone] and press option 4 to reach your doctor's medical assistant. If no one answers, please leave a voicemail as directed and we will return your call as soon as possible. Messages left after 4 pm will be answered the following business day.   You may also send us a message via MyChart. We typically respond to MyChart messages within 1-2 business days.  For prescription refills, please ask your pharmacy to contact our office. Our fax number is [phone].  If you have an urgent issue when the clinic is closed that cannot wait until the next business day, you can page your doctor at the number below.    Please note that while we do our best to be available for urgent issues outside of office hours, we are not available 24/7.   If you have an urgent issue and are unable to reach us, you may choose to seek medical care at your doctor's office, retail clinic, urgent care center, or emergency room.  If you have a medical emergency, please immediately call 911 or go to the emergency department.  Pager Numbers  - Dr. Kowalski: [phone]  - Dr. Moye: [phone]  - Dr. Stewart:  [phone]  In the event of inclement weather, please call our main line at [phone] for an update on the status of any delays or closures.  Dermatology Medication Tips: Please keep the boxes that topical medications come in in order to help keep track of the instructions about where and how to use these. Pharmacies typically print the medication instructions only on the boxes and not directly on the medication tubes.   If your medication is too expensive, please contact our office at [phone] option 4 or send us a message through MyChart.   We are unable to tell what your co-pay for medications will be in advance as this is different depending on your insurance coverage. However, we may be able to find a substitute medication at lower cost or fill out paperwork to get insurance to cover a needed medication.   If a prior authorization is required to get your medication covered by your insurance company, please allow us 1-2 business days to complete this process.  Drug prices often vary depending on where the prescription is filled and some pharmacies may offer cheaper prices.  The website www.goodrx.com contains coupons for medications through different pharmacies. The prices here do not account for what the cost may be with help from insurance (it may be cheaper with your insurance), but the website can give you the price if you did not use any insurance.  - You can print the associated coupon and take it with   your prescription to the pharmacy.  - You may also stop by our office during regular business hours and pick up a GoodRx coupon card.  - If you need your prescription sent electronically to a different pharmacy, notify our office through [facility] MyChart or by phone at [phone] option 4.     Si Usted Necesita Algo Despus de Su Visita  Tambin puede enviarnos un mensaje a travs de MyChart. Por lo general respondemos a los mensajes de MyChart en el transcurso de 1 a 2  das hbiles.  Para renovar recetas, por favor pida a su farmacia que se ponga en contacto con nuestra oficina. Nuestro nmero de fax es el [phone].  Si tiene un asunto urgente cuando la clnica est cerrada y que no puede esperar hasta el siguiente da hbil, puede llamar/localizar a su doctor(a) al nmero que aparece a continuacin.   Por favor, tenga en cuenta que aunque hacemos todo lo posible para estar disponibles para asuntos urgentes fuera del horario de oficina, no estamos disponibles las 24 horas del da, los 7 das de la semana.   Si tiene un problema urgente y no puede comunicarse con nosotros, puede optar por buscar atencin mdica  en el consultorio de su doctor(a), en una clnica privada, en un centro de atencin urgente o en una sala de emergencias.  Si tiene una emergencia mdica, por favor llame inmediatamente al 911 o vaya a la sala de emergencias.  Nmeros de bper  - Dr. Kowalski: [phone]  - Dra. Moye: [phone]  - Dra. Stewart: [phone]  En caso de inclemencias del tiempo, por favor llame a nuestra lnea principal al [phone] para una actualizacin sobre el estado de cualquier retraso o cierre.  Consejos para la medicacin en dermatologa: Por favor, guarde las cajas en las que vienen los medicamentos de uso tpico para ayudarle a seguir las instrucciones sobre dnde y cmo usarlos. Las farmacias generalmente imprimen las instrucciones del medicamento slo en las cajas y no directamente en los tubos del medicamento.   Si su medicamento es muy caro, por favor, pngase en contacto con nuestra oficina llamando al [phone] y presione la opcin 4 o envenos un mensaje a travs de MyChart.   No podemos decirle cul ser su copago por los medicamentos por adelantado ya que esto es diferente dependiendo de la cobertura de su seguro. Sin embargo, es posible que podamos encontrar un medicamento sustituto a menor costo o llenar un formulario para que el  seguro cubra el medicamento que se considera necesario.   Si se requiere una autorizacin previa para que su compaa de seguros cubra su medicamento, por favor permtanos de 1 a 2 das hbiles para completar este proceso.  Los precios de los medicamentos varan con frecuencia dependiendo del lugar de dnde se surte la receta y alguna farmacias pueden ofrecer precios ms baratos.  El sitio web www.goodrx.com tiene cupones para medicamentos de diferentes farmacias. Los precios aqu no tienen en cuenta lo que podra costar con la ayuda del seguro (puede ser ms barato con su seguro), pero el sitio web puede darle el precio si no utiliz ningn seguro.  - Puede imprimir el cupn correspondiente y llevarlo con su receta a la farmacia.  - Tambin puede pasar por nuestra oficina durante el horario de atencin regular y recoger una tarjeta de cupones de GoodRx.  - Si necesita que su receta se enve electrnicamente a una farmacia diferente, informe a nuestra oficina a travs de MyChart de [facility]   o por telfono llamando al [phone] y presione la opcin 4.  

## 2022-01-24 ENCOUNTER — Encounter: Payer: Self-pay | Admitting: Dermatology

## 2022-03-11 DIAGNOSIS — H524 Presbyopia: Secondary | ICD-10-CM | POA: Diagnosis not present

## 2022-06-17 ENCOUNTER — Other Ambulatory Visit: Payer: Self-pay | Admitting: Physician Assistant

## 2022-06-17 DIAGNOSIS — Z1231 Encounter for screening mammogram for malignant neoplasm of breast: Secondary | ICD-10-CM

## 2022-06-25 ENCOUNTER — Ambulatory Visit: Payer: 59 | Admitting: Dermatology

## 2022-07-02 ENCOUNTER — Ambulatory Visit: Payer: 59 | Admitting: Dermatology

## 2022-08-05 ENCOUNTER — Ambulatory Visit
Admission: RE | Admit: 2022-08-05 | Discharge: 2022-08-05 | Disposition: A | Discharge status: Home / Self Care | Payer: 59 | Source: Ambulatory Visit | Attending: Physician Assistant | Admitting: Physician Assistant

## 2022-08-05 DIAGNOSIS — Z1231 Encounter for screening mammogram for malignant neoplasm of breast: Secondary | ICD-10-CM | POA: Insufficient documentation

## 2022-11-08 ENCOUNTER — Other Ambulatory Visit: Payer: Self-pay | Admitting: Physician Assistant

## 2022-11-08 DIAGNOSIS — I10 Essential (primary) hypertension: Secondary | ICD-10-CM

## 2022-11-12 ENCOUNTER — Other Ambulatory Visit: Payer: Self-pay

## 2022-11-12 NOTE — Telephone Encounter (Signed)
Jocelyne called [facility] requesting Rx refill for Norvasc 5 mg.  [facility] sent an electronic refill request for Norvasc 5 mg.  Re-routed the electronic refill request already in Epic to [employer], PA-C.  AMD

## 2023-01-01 ENCOUNTER — Ambulatory Visit: Payer: Self-pay

## 2023-01-01 DIAGNOSIS — Z0000 Encounter for general adult medical examination without abnormal findings: Secondary | ICD-10-CM

## 2023-01-01 LAB — POCT URINALYSIS DIPSTICK
Bilirubin, UA: NEGATIVE
Glucose, UA: NEGATIVE
Ketones, UA: NEGATIVE
Leukocytes, UA: NEGATIVE
Nitrite, UA: NEGATIVE
Protein, UA: NEGATIVE
Spec Grav, UA: 1.02 (ref 1.010–1.025)
Urobilinogen, UA: 0.2 E.U./dL
pH, UA: 6 (ref 5.0–8.0)

## 2023-01-01 NOTE — Progress Notes (Signed)
Pt completed labs for physical./Cl,RMA

## 2023-01-02 LAB — CMP12+LP+TP+TSH+6AC+CBC/D/PLT
ALT: 27 IU/L (ref 0–32)
AST: 27 IU/L (ref 0–40)
Albumin/Globulin Ratio: 2 (ref 1.2–2.2)
Albumin: 4.4 g/dL (ref 3.8–4.9)
Alkaline Phosphatase: 59 IU/L (ref 44–121)
BUN/Creatinine Ratio: 26 — ABNORMAL HIGH (ref 9–23)
BUN: 18 mg/dL (ref 6–24)
Basophils Absolute: 0 10*3/uL (ref 0.0–0.2)
Basos: 0 %
Bilirubin Total: 0.5 mg/dL (ref 0.0–1.2)
Calcium: 9.2 mg/dL (ref 8.7–10.2)
Chloride: 104 mmol/L (ref 96–106)
Chol/HDL Ratio: 3.8 ratio (ref 0.0–4.4)
Cholesterol, Total: 198 mg/dL (ref 100–199)
Creatinine, Ser: 0.68 mg/dL (ref 0.57–1.00)
EOS (ABSOLUTE): 0.4 10*3/uL (ref 0.0–0.4)
Eos: 4 %
Estimated CHD Risk: 0.8 times avg. (ref 0.0–1.0)
Free Thyroxine Index: 1.9 (ref 1.2–4.9)
GGT: 29 IU/L (ref 0–60)
Globulin, Total: 2.2 g/dL (ref 1.5–4.5)
Glucose: 102 mg/dL — ABNORMAL HIGH (ref 70–99)
HDL: 52 mg/dL (ref >39)
Hematocrit: 39.7 % (ref 34.0–46.6)
Hemoglobin: 13.4 g/dL (ref 11.1–15.9)
Immature Grans (Abs): 0 10*3/uL (ref 0.0–0.1)
Immature Granulocytes: 0 %
Iron: 79 ug/dL (ref 27–159)
LDH: 169 IU/L (ref 119–226)
LDL Chol Calc (NIH): 112 mg/dL — ABNORMAL HIGH (ref 0–99)
Lymphocytes Absolute: 2.6 10*3/uL (ref 0.7–3.1)
Lymphs: 31 %
MCH: 31.2 pg (ref 26.6–33.0)
MCHC: 33.8 g/dL (ref 31.5–35.7)
MCV: 92 fL (ref 79–97)
Monocytes Absolute: 0.5 10*3/uL (ref 0.1–0.9)
Monocytes: 6 %
Neutrophils Absolute: 4.8 10*3/uL (ref 1.4–7.0)
Neutrophils: 59 %
Phosphorus: 3.3 mg/dL (ref 3.0–4.3)
Platelets: 257 10*3/uL (ref 150–450)
Potassium: 4.2 mmol/L (ref 3.5–5.2)
RBC: 4.3 x10E6/uL (ref 3.77–5.28)
RDW: 12.9 % (ref 11.7–15.4)
Sodium: 142 mmol/L (ref 134–144)
T3 Uptake Ratio: 28 % (ref 24–39)
T4, Total: 6.7 ug/dL (ref 4.5–12.0)
TSH: 1.67 u[IU]/mL (ref 0.450–4.500)
Total Protein: 6.6 g/dL (ref 6.0–8.5)
Triglycerides: 195 mg/dL — ABNORMAL HIGH (ref 0–149)
Uric Acid: 4.2 mg/dL (ref 3.0–7.2)
VLDL Cholesterol Cal: 34 mg/dL (ref 5–40)
WBC: 8.3 10*3/uL (ref 3.4–10.8)
eGFR: 102 mL/min/{1.73_m2} (ref >59)

## 2023-01-06 ENCOUNTER — Other Ambulatory Visit: Payer: Self-pay | Admitting: Physician Assistant

## 2023-01-06 ENCOUNTER — Ambulatory Visit: Payer: Self-pay | Admitting: Physician Assistant

## 2023-01-06 ENCOUNTER — Encounter: Payer: Self-pay | Admitting: Physician Assistant

## 2023-01-06 VITALS — BP 154/87 | HR 82 | Temp 98.0°F | Resp 14 | Ht 65.0 in | Wt 170.0 lb

## 2023-01-06 DIAGNOSIS — Z0000 Encounter for general adult medical examination without abnormal findings: Secondary | ICD-10-CM

## 2023-01-06 DIAGNOSIS — I10 Essential (primary) hypertension: Secondary | ICD-10-CM

## 2023-01-06 LAB — POCT URINALYSIS DIPSTICK
Bilirubin, UA: NEGATIVE
Blood, UA: POSITIVE
Glucose, UA: NEGATIVE
Ketones, UA: NEGATIVE
Leukocytes, UA: NEGATIVE
Nitrite, UA: NEGATIVE
Protein, UA: NEGATIVE
Spec Grav, UA: 1.01 (ref 1.010–1.025)
Urobilinogen, UA: 0.2 E.U./dL
pH, UA: 6 (ref 5.0–8.0)

## 2023-01-06 MED ORDER — ROSUVASTATIN CALCIUM 40 MG PO TABS
40.0000 mg | ORAL_TABLET | Freq: Every day | ORAL | 3 refills | Status: DC
Start: 2023-01-06 — End: 2024-01-29

## 2023-01-06 MED ORDER — AMLODIPINE BESYLATE 5 MG PO TABS: 5.0000 mg | ORAL_TABLET | Freq: Every day | ORAL | 3 refills | Status: DC

## 2023-01-06 NOTE — Progress Notes (Signed)
Here for annual physical with provider.  Repeat UA obtained.  Denies any complaints.

## 2023-01-06 NOTE — Progress Notes (Signed)
[facility] ____________________________________________   None    (approximate)  I have reviewed the triage vital signs and the nursing notes.   HISTORY  Chief Complaint No chief complaint on file.    HPI Karen Willis is a 57 y.o. female patient presents for annual physical exam.  Patient voiced no concerns or complaints.  Patient requests refill of medicine for hypertension and hyperlipidemia.         Past Medical History:  Diagnosis Date   Anxiety    SITUATIONAL   Dysplastic Nevus 06/14/2020   LUQA, Severe atypia, excised 10/02/20   Dysplastic nevus 06/17/2021   mod-sev, left lateral abdomen near side, excised 07/23/21   Hypercholesteremia     Patient Active Problem List   Diagnosis Date Noted   Migraine headache 09/03/2021   High serum low density lipoprotein (LDL) cholesterol 09/03/2021   Personal history of urinary (tract) infections 09/03/2021   Right knee pain 09/03/2021   Essential hypertension 09/03/2021   Insomnia 09/03/2021   Lumbago 09/03/2021   Tobacco use 10/14/2016    Past Surgical History:  Procedure Laterality Date   CESAREAN SECTION  03/25/1995   [facility]   COLONOSCOPY     MANDIBLE FRACTURE SURGERY     AGE 71   TUBAL LIGATION     [facility]   WISDOM TOOTH EXTRACTION     20S    Prior to Admission medications   Medication Sig Start Date End Date Taking? Authorizing Provider  amLODipine (NORVASC) 5 MG tablet TAKE 1 TABLET (5 MG TOTAL) BY MOUTH DAILY. 11/12/22  Yes Joni Reining, PA-C  Coenzyme Q10 100 MG CHEW Chew 1 tablet (100 mg total) by mouth Nightly. 12/28/19  Yes Emily Filbert, MD  fluticasone (FLONASE) 50 MCG/ACT nasal spray USE 1 SPRAY INTO EACH NOSTRIL TWICE A DAY 02/03/18  Yes [provider]  rosuvastatin (CRESTOR) 40 MG tablet Take 1 tablet (40 mg total) by mouth daily. 11/14/21  Yes Joni Reining, PA-C  cyclobenzaprine (FLEXERIL) 5 MG tablet Take 1 tablet (5 mg total) by mouth  3 (three) times daily as needed (migraine phenomenon, muscle relaxant). Patient not taking: Reported on 01/06/2023 09/03/21   Rae Halsted, PA-C  meloxicam (MOBIC) 15 MG tablet TAKE 1 TABLET BY MOUTH EVERY MORNING AS NEEDED Patient not taking: Reported on 01/06/2023 04/03/20   Emily Filbert, MD    Allergies Macrobid [nitrofurantoin], Sulfa antibiotics, Penicillins, and Ampicillin  Family History  Problem Relation Age of Onset   Hyperlipidemia Mother    Heart disease Father    Stroke Father    Diabetes Father    Hyperlipidemia Sister    Breast cancer Neg Hx     Social History Social History   Tobacco Use   Smoking status: Every Day    Packs/day: .5    Types: Cigarettes   Smokeless tobacco: Never  Vaping Use   Vaping Use: Never used  Substance Use Topics   Alcohol use: Yes    Comment: OCC   Drug use: No    Review of Systems Constitutional: No fever/chills Eyes: No visual changes. ENT: No sore throat. Cardiovascular: Denies chest pain. Respiratory: Denies shortness of breath. Gastrointestinal: No abdominal pain.  No nausea, no vomiting.  No diarrhea.  No constipation. Genitourinary: Negative for dysuria. Musculoskeletal: Negative for back pain. Skin: Negative for rash. Neurological: Negative for headaches, focal weakness or numbness. Psychiatric: Anxiety Endocrine: Hyperlipidemia and hypertension Allergic/Immunilogical: Macrobid, penicillin, and sulfa antibiotics ____________________________________________  PHYSICAL EXAM:  VITAL SIGNS: BP 154/87  Pulse 82  Resp 14  Temp 98 F (36.7 C)  SpO2 98 %  Weight 170 lb (77.1 kg)  Height 5\' 5"  (1.651 m)   BMI 28.29 kg/m2  BSA 1.88 m2     Constitutional: Alert and oriented. Well appearing and in no acute distress. Eyes: Conjunctivae are normal. PERRL. EOMI. Head: Atraumatic. Nose: No congestion/rhinnorhea. Mouth/Throat: Mucous membranes are moist.  Oropharynx non-erythematous. Neck: No stridor.  No  cervical spine tenderness to palpation. Hematological/Lymphatic/Immunilogical: No cervical lymphadenopathy. Cardiovascular: Normal rate, regular rhythm. Grossly normal heart sounds.  Good peripheral circulation. Respiratory: Normal respiratory effort.  No retractions. Lungs CTAB. Gastrointestinal: Soft and nontender. No distention. No abdominal bruits. No CVA tenderness. Genitourinary: Deferred Musculoskeletal: No lower extremity tenderness nor edema.  No joint effusions. Neurologic:  Normal speech and language. No gross focal neurologic deficits are appreciated. No gait instability. Skin:  Skin is warm, dry and intact. No rash noted. Psychiatric: Mood and affect are normal. Speech and behavior are normal.  ____________________________________________   LABS           Component Ref Range & Units 5 d ago (01/01/23) 1 yr ago (11/05/21) 1 yr ago (03/20/21) 2 yr ago (12/21/20) 2 yr ago (12/17/20) 2 yr ago (03/28/20) 3 yr ago (12/15/19)  Glucose 70 - 99 mg/dL 161 High  93   90 R  98 R  Uric Acid 3.0 - 7.2 mg/dL 4.2 4.8 CM   5.0 CM  4.9 CM  Comment:            Therapeutic target for gout patients: <6.0  BUN 6 - 24 mg/dL 18 13   14  20   Creatinine, Ser 0.57 - 1.00 mg/dL 0.96 0.45   4.09  8.11  eGFR >59 mL/min/1.73 102 97   83    BUN/Creatinine Ratio 9 - 23 26 High  18   17  26  High   Sodium 134 - 144 mmol/L 142 137   137  142  Potassium 3.5 - 5.2 mmol/L 4.2 4.6   4.2  5.0  Chloride 96 - 106 mmol/L 104 98   99  104  Calcium 8.7 - 10.2 mg/dL 9.2 9.3   9.4  91.4  Phosphorus 3.0 - 4.3 mg/dL 3.3 3.4   2.6 Low   3.9  Total Protein 6.0 - 8.5 g/dL 6.6 6.9   7.4  6.9  Albumin 3.8 - 4.9 g/dL 4.4 4.5   4.8  4.5  Globulin, Total 1.5 - 4.5 g/dL 2.2 2.4   2.6  2.4  Albumin/Globulin Ratio 1.2 - 2.2 2.0 1.9   1.8  1.9  Bilirubin Total 0.0 - 1.2 mg/dL 0.5 0.5   0.4  0.3  Alkaline Phosphatase 44 - 121 IU/L 59 77   63  59 R  LDH 119 - 226 IU/L 169 179   206  182  AST 0 - 40 IU/L 27 27    21  24   ALT 0 - 32 IU/L 27 30   26  20   GGT 0 - 60 IU/L 29 39   36  18  Iron 27 - 159 ug/dL 79 782   956  87  Cholesterol, Total 100 - 199 mg/dL 213 086 High  578  469 High  210 High  293 High   Triglycerides 0 - 149 mg/dL 629 High  528 High  413 High   301 High  231 High  167 High   HDL >39  mg/dL 52 58 59  65 44 59  VLDL Cholesterol Cal 5 - 40 mg/dL 34 60 High  32  55 High  41 High  31  LDL Chol Calc (NIH) 0 - 99 mg/dL 161 High  096 High  93  155 High  125 High  203 High   Chol/HDL Ratio 0.0 - 4.4 ratio 3.8 4.1 CM 3.1 CM  4.2 CM 4.8 High  CM 5.0 High  CM  Comment:                                   T. Chol/HDL Ratio                                             Men  Women                               1/2 Avg.Risk  3.4    3.3                                   Avg.Risk  5.0    4.4                                2X Avg.Risk  9.6    7.1                                3X Avg.Risk 23.4   11.0  Estimated CHD Risk 0.0 - 1.0 times avg. 0.8 0.9 CM   1.0 CM  1.3 High  CM  Comment: The CHD Risk is based on the T. Chol/HDL ratio. Other factors affect CHD Risk such as hypertension, smoking, diabetes, severe obesity, and family history of premature CHD.  TSH 0.450 - 4.500 uIU/mL 1.670 2.050   2.810  1.540  T4, Total 4.5 - 12.0 ug/dL 6.7 7.1   7.0  6.9  T3 Uptake Ratio 24 - 39 % 28 26   24  25   Free Thyroxine Index 1.2 - 4.9 1.9 1.8   1.7  1.7  WBC 3.4 - 10.8 x10E3/uL 8.3 7.8  9.6 CANCELED R, CM  7.0  RBC 3.77 - 5.28 x10E6/uL 4.30 4.61  4.33 CANCELED R, CM  4.45  Hemoglobin 11.1 - 15.9 g/dL 04.5 40.9  81.1 CANCELED R, CM  14.4  Hematocrit 34.0 - 46.6 % 39.7 43.3  41.6 CANCELED R, CM  41.9  MCV 79 - 97 fL 92 94  96 CANCELED R, CM  94  MCH 26.6 - 33.0 pg 31.2 31.2  32.1 CANCELED R, CM  32.4  MCHC 31.5 - 35.7 g/dL 91.4 78.2  95.6 CANCELED R, CM  34.4  RDW 11.7 - 15.4 % 12.9 12.8  12.3 CANCELED R, CM  12.6  Platelets 150 - 450 x10E3/uL 257 279  259 CANCELED R, CM  259   Neutrophils Not Estab. % 59 53  58 CANCELED R, CM  51  Lymphs Not Estab. % 31 36  32 CANCELED R, CM  36  Monocytes Not Estab. % 6 6  7  CANCELED  R, CM  7  Eos Not Estab. % 4 4  2  CANCELED R, CM  5  Basos Not Estab. % 0 1  1 CANCELED R, CM  1  Neutrophils Absolute 1.4 - 7.0 x10E3/uL 4.8 4.1  5.6 CANCELED R, CM  3.6  Lymphocytes Absolute 0.7 - 3.1 x10E3/uL 2.6 2.8  3.1 CANCELED R, CM  2.5  Monocytes Absolute 0.1 - 0.9 x10E3/uL 0.5 0.5  0.6 CANCELED R, CM  0.5  EOS (ABSOLUTE) 0.0 - 0.4 x10E3/uL 0.4 0.3  0.2 CANCELED R, CM  0.3  Basophils Absolute 0.0 - 0.2 x10E3/uL 0.0 0.1  0.1 CANCELED R, CM  0.1  Immature Granulocytes Not Estab. % 0 0  0 CANCELED R, CM  0  Immature Grans (Abs)                    Component Ref Range & Units 15:22 5 d ago 1 yr ago 2 yr ago 3 yr ago  Color, UA yellow amber Light Yellow yellow yellow  Clarity, UA clear cloudy Clear clear clear  Glucose, UA Negative Negative Negative Negative Negative Negative  Bilirubin, UA neg neg Negative negative neg  Ketones, UA neg neg Negative negative neg  Spec Grav, UA 1.010 - 1.025 1.010 1.020 1.015 1.015 1.025  Blood, UA pos 1+ Positive CM negative pos CM  Comment: 10 Ery/uL  pH, UA 5.0 - 8.0 6.0 6.0 7.0 6.0 6.0  Protein, UA Negative Negative Negative Negative Negative Negative  Urobilinogen, UA 0.2 or 1.0 E.U./dL 0.2 0.2 0.2 0.2 0.2  Nitrite, UA neg neg Negative negative neg  Leukocytes, UA Negative Negative Negative Negative Negative Negative  Appearance dark   light clear            ____________________________________________  EKG  Sinus rhythm at 66 bpm ____________________________________________    ____________________________________________   INITIAL IMPRESSION / ASSESSMENT AND PLAN   As part of my medical decision making, I reviewed the following data within the electronic MEDICAL RECORD NUMBER      No acute findings on physical exam, EKG, or labs.         ____________________________________________   FINAL CLINICAL IMPRESSION Well exam   ED Discharge Orders          Ordered    POCT Urinalysis Dipstick (CPT 81002)        01/06/23 1516             Note:  This document was prepared using Dragon voice recognition software and may include unintentional dictation errors.

## 2023-01-21 ENCOUNTER — Ambulatory Visit: Payer: 59 | Admitting: Dermatology

## 2023-03-26 DIAGNOSIS — H524 Presbyopia: Secondary | ICD-10-CM | POA: Diagnosis not present

## 2023-03-26 DIAGNOSIS — H25813 Combined forms of age-related cataract, bilateral: Secondary | ICD-10-CM | POA: Diagnosis not present

## 2023-04-08 ENCOUNTER — Encounter: Payer: Self-pay | Admitting: Psychiatry

## 2023-04-08 ENCOUNTER — Ambulatory Visit: Payer: 59 | Admitting: Psychiatry

## 2023-04-08 VITALS — BP 162/92 | HR 75 | Ht 66.0 in | Wt 168.5 lb

## 2023-04-08 DIAGNOSIS — R200 Anesthesia of skin: Secondary | ICD-10-CM | POA: Diagnosis not present

## 2023-04-08 MED ORDER — GABAPENTIN 300 MG PO CAPS: 300.0000 mg | ORAL_CAPSULE | Freq: Every day | ORAL | 6 refills | Status: AC

## 2023-04-08 NOTE — Progress Notes (Signed)
[facility]  PATIENT: Karen Willis DOB: 07-13-1966  REFERRING CLINICIAN: Irene Limbo., MD HISTORY FROM: self, sister REASON FOR VISIT: numbness   HISTORICAL  CHIEF COMPLAINT:  Chief Complaint  Patient presents with   New Patient (Initial Visit)    Rm EMG 2..  sister, [place]. Numbness around L eye 2-3 months ago, sporadic.Marland Kitchen  Then now constant L eye, spread to L face. numbness, tingling no droop. Seen Dr. Alvester Morin with [facility].     HISTORY OF PRESENT ILLNESS:  The patient presents for evaluation of numbness/tingling around her left eye which began 2-3 months ago. Initially this was intermittent but occurred on and off every day. Now it is constant and has spread to involve her left cheek and chin in a patchy distribution. She denies facial pain, facial weakness, vision changes, or migraines. Has had some mild headaches in the past couple of months but nothing severe. Has a history of cervical DDD which causes intermittent tingling in her arms.   OTHER MEDICAL CONDITIONS: migraine, HTN, HLD   REVIEW OF SYSTEMS: Full 14 system review of systems performed and negative with exception of: facial numbness  ALLERGIES: Allergies  Allergen Reactions   Macrobid [Nitrofurantoin] Hives   Sulfa Antibiotics Other (See Comments)    MADE INSIDE OF MOUTH RAW   Penicillins Hives   Ampicillin Hives    HOME MEDICATIONS: Outpatient Medications Prior to Visit  Medication Sig Dispense Refill   amLODipine (NORVASC) 5 MG tablet Take 1 tablet (5 mg total) by mouth daily. 90 tablet 3   Coenzyme Q10 100 MG CHEW Chew 1 tablet (100 mg total) by mouth Nightly. 30 tablet 3   cyclobenzaprine (FLEXERIL) 5 MG tablet Take 1 tablet (5 mg total) by mouth 3 (three) times daily as needed (migraine phenomenon, muscle relaxant). 30 tablet 0   fluticasone (FLONASE) 50 MCG/ACT nasal spray USE 1 SPRAY INTO EACH NOSTRIL TWICE A DAY  0   meloxicam (MOBIC) 15 MG tablet TAKE 1 TABLET BY  MOUTH EVERY MORNING AS NEEDED 20 tablet 6   rosuvastatin (CRESTOR) 40 MG tablet Take 1 tablet (40 mg total) by mouth daily. 90 tablet 3   No facility-administered medications prior to visit.    PAST MEDICAL HISTORY: Past Medical History:  Diagnosis Date   Anxiety    SITUATIONAL   Dysplastic Nevus 06/14/2020   LUQA, Severe atypia, excised 10/02/20   Dysplastic nevus 06/17/2021   mod-sev, left lateral abdomen near side, excised 07/23/21   Hypercholesteremia     PAST SURGICAL HISTORY: Past Surgical History:  Procedure Laterality Date   CESAREAN SECTION  03/25/1995   BREECH   COLONOSCOPY     MANDIBLE FRACTURE SURGERY Bilateral    AGE 57   TUBAL LIGATION     PJR   WISDOM TOOTH EXTRACTION     20S    FAMILY HISTORY: Family History  Problem Relation Age of Onset   Hyperlipidemia Mother    Heart disease Father    Stroke Father    Diabetes Father    Hyperlipidemia Sister    Breast cancer Neg Hx     SOCIAL HISTORY: Social History   Socioeconomic History   Marital status: Significant Other    Spouse name: Not on file   Number of children: Not on file   Years of education: Not on file   Highest education level: Not on file  Occupational History   Not on file  Tobacco Use   Smoking status: Every Day  Current packs/day: 0.50    Types: Cigarettes   Smokeless tobacco: Never  Vaping Use   Vaping status: Never Used  Substance and Sexual Activity   Alcohol use: Yes    Comment: OCC   Drug use: No   Sexual activity: Yes    Birth control/protection: Surgical    Comment: TUBAL  Other Topics Concern   Not on file  Social History Narrative   Caffiene coffee 2 cups daily   Work :  Holiday representative for city    Social Determinants of Health   Financial Resource Strain: Not on file  Food Insecurity: Not on file  Transportation Needs: Not on file  Physical Activity: Not on file  Stress: Not on file  Social Connections: Not on file  Intimate Partner Violence: Not on  file     PHYSICAL EXAM  GENERAL EXAM/CONSTITUTIONAL: Vitals:  Vitals:   04/08/23 1317  BP: (!) 162/92  Pulse: 75  Weight: 168 lb 8 oz (76.4 kg)  Height: 5\' 6"  (1.676 m)   Body mass index is 27.2 kg/m. Wt Readings from Last 3 Encounters:  04/08/23 168 lb 8 oz (76.4 kg)  01/06/23 170 lb (77.1 kg)  11/14/21 163 lb (73.9 kg)    NEUROLOGIC: MENTAL STATUS:  awake, alert, oriented to person, place and time recent and remote memory intact normal attention and concentration language fluent, comprehension intact, naming intact fund of knowledge appropriate  CRANIAL NERVE:  2nd, 3rd, 4th, 6th - pupils equal and reactive to light, visual fields full to confrontation, extraocular muscles intact, no nystagmus 5th - decreased sensation left V1 7th - facial strength symmetric 8th - hearing intact 9th - palate elevates symmetrically, uvula midline 11th - shoulder shrug symmetric 12th - tongue protrusion midline  MOTOR:  normal bulk and tone, full strength in the BUE, BLE  SENSORY:  normal and symmetric to light touch all 4 extremities  COORDINATION:  finger-nose-finger intact bilaterally  REFLEXES:  deep tendon reflexes present and symmetric  GAIT/STATION:  normal     DIAGNOSTIC DATA (LABS, IMAGING, TESTING) - I reviewed patient records, labs, notes, testing and imaging myself where available.  Lab Results  Component Value Date   WBC 8.3 01/01/2023   HGB 13.4 01/01/2023   HCT 39.7 01/01/2023   MCV 92 01/01/2023   PLT 257 01/01/2023      Component Value Date/Time   NA 142 01/01/2023 0852   K 4.2 01/01/2023 0852   CL 104 01/01/2023 0852   GLUCOSE 102 (H) 01/01/2023 0852   BUN 18 01/01/2023 0852   CREATININE 0.68 01/01/2023 0852   CALCIUM 9.2 01/01/2023 0852   PROT 6.6 01/01/2023 0852   ALBUMIN 4.4 01/01/2023 0852   AST 27 01/01/2023 0852   ALT 27 01/01/2023 0852   ALKPHOS 59 01/01/2023 0852   BILITOT 0.5 01/01/2023 0852   GFRNONAA 87 12/15/2019 0831    GFRAA 100 12/15/2019 0831   Lab Results  Component Value Date   CHOL 198 01/01/2023   HDL 52 01/01/2023   LDLCALC 112 (H) 01/01/2023   TRIG 195 (H) 01/01/2023   CHOLHDL 3.8 01/01/2023   No results found for: "HGBA1C" No results found for: "VITAMINB12" Lab Results  Component Value Date   TSH 1.670 01/01/2023    ASSESSMENT AND PLAN  57 y.o. female with a history of migraine, HTN, HLD who presents for evaluation of numbness/paresthesias in her left face. Exam with decreased sensation in left V1. Will order MRI brain/trigeminal to evaluate for structural causes  of sensation loss including trigeminal nerve compression. She finds the paresthesias bothersome and would like to try a medication to reduce these symptoms. Will start gabapentin.   1. Facial numbness       PLAN: -MRI brain/trigeminal -Start gabapentin 300 mg at bedtime for paresthesias, uptitrate as needed  Orders Placed This Encounter  Procedures   MR FACE/TRIGEMINAL WO/W CM   MR BRAIN W WO CONTRAST    Meds ordered this encounter  Medications   gabapentin (NEURONTIN) 300 MG capsule    Sig: Take 1 capsule (300 mg total) by mouth at bedtime.    Dispense:  30 capsule    Refill:  6    Return in about 4 months (around 08/08/2023).    Ocie Doyne, MD 04/08/23 1:52 PM  I spent an average of 30 minutes chart reviewing and counseling the patient, with at least 50% of the time face to face with the patient.   [facility] [address] [place], [place] [address] [phone]

## 2023-04-10 ENCOUNTER — Telehealth: Payer: Self-pay | Admitting: Psychiatry

## 2023-04-10 NOTE — Telephone Encounter (Signed)
[employer] pending medical review case [id number] for [facility]

## 2023-04-15 ENCOUNTER — Telehealth: Payer: Self-pay | Admitting: Psychiatry

## 2023-04-15 NOTE — Telephone Encounter (Signed)
Drucie Opitz: [id number] exp. 04/10/23-10/10/23 sent to [facility] [phone]

## 2023-04-27 ENCOUNTER — Ambulatory Visit
Admission: RE | Admit: 2023-04-27 | Discharge: 2023-04-27 | Disposition: A | Discharge status: Home / Self Care | Payer: 59 | Source: Ambulatory Visit | Attending: Psychiatry

## 2023-04-27 ENCOUNTER — Ambulatory Visit
Admission: RE | Admit: 2023-04-27 | Discharge: 2023-04-27 | Disposition: A | Discharge status: Home / Self Care | Payer: 59 | Source: Ambulatory Visit | Attending: Psychiatry | Admitting: Psychiatry

## 2023-04-27 DIAGNOSIS — R202 Paresthesia of skin: Secondary | ICD-10-CM | POA: Diagnosis not present

## 2023-04-27 DIAGNOSIS — R200 Anesthesia of skin: Secondary | ICD-10-CM

## 2023-04-27 MED ORDER — GADOBUTROL 1 MMOL/ML IV SOLN
7.0000 mL | Freq: Once | INTRAVENOUS | Status: AC | PRN
  Administered 2023-04-27: 7 mL via INTRAVENOUS

## 2023-05-06 ENCOUNTER — Telehealth: Payer: Self-pay | Admitting: Neurology

## 2023-05-06 NOTE — Telephone Encounter (Signed)
Call to [facility] imaging, left message to retrun to Dr. Terrace Arabia

## 2023-05-06 NOTE — Telephone Encounter (Signed)
Call to patient, she is aware we don't have results and advised that we have been told they are several weeks behind. Advised I would send to Dr Terrace Arabia and let her know she is inquiring on results. Patient appreciative of call

## 2023-05-06 NOTE — Telephone Encounter (Signed)
Pt called wanting to know when she will be called with her MRI results. Please advise.

## 2023-08-31 ENCOUNTER — Encounter: Payer: Self-pay | Admitting: Neurology

## 2023-08-31 ENCOUNTER — Ambulatory Visit: Payer: 59 | Admitting: Neurology

## 2023-11-19 ENCOUNTER — Encounter

## 2023-11-26 ENCOUNTER — Encounter: Admitting: Physician Assistant

## 2023-12-25 ENCOUNTER — Ambulatory Visit: Payer: Self-pay

## 2023-12-25 DIAGNOSIS — Z0000 Encounter for general adult medical examination without abnormal findings: Secondary | ICD-10-CM

## 2023-12-25 LAB — POCT URINALYSIS DIPSTICK
Bilirubin, UA: NEGATIVE
Glucose, UA: NEGATIVE
Ketones, UA: NEGATIVE
Leukocytes, UA: NEGATIVE
Nitrite, UA: NEGATIVE
Protein, UA: NEGATIVE
Spec Grav, UA: 1.015 (ref 1.010–1.025)
Urobilinogen, UA: 0.2 U/dL
pH, UA: 7.5 (ref 5.0–8.0)

## 2023-12-25 NOTE — Progress Notes (Signed)
 Fasting labs, UA and EKG completed pre physical.

## 2023-12-26 LAB — CMP12+LP+TP+TSH+6AC+CBC/D/PLT
ALT: 34 IU/L — ABNORMAL HIGH (ref 0–32)
AST: 31 IU/L (ref 0–40)
Albumin: 4.7 g/dL (ref 3.8–4.9)
Alkaline Phosphatase: 62 IU/L (ref 44–121)
BUN/Creatinine Ratio: 21 (ref 9–23)
BUN: 15 mg/dL (ref 6–24)
Basophils Absolute: 0 10*3/uL (ref 0.0–0.2)
Basos: 1 %
Bilirubin Total: 0.7 mg/dL (ref 0.0–1.2)
Calcium: 9.7 mg/dL (ref 8.7–10.2)
Chloride: 100 mmol/L (ref 96–106)
Chol/HDL Ratio: 3.6 ratio (ref 0.0–4.4)
Cholesterol, Total: 201 mg/dL — ABNORMAL HIGH (ref 100–199)
Creatinine, Ser: 0.72 mg/dL (ref 0.57–1.00)
EOS (ABSOLUTE): 0.4 10*3/uL (ref 0.0–0.4)
Eos: 6 %
Estimated CHD Risk: 0.6 times avg. (ref 0.0–1.0)
Free Thyroxine Index: 2.1 (ref 1.2–4.9)
GGT: 33 IU/L (ref 0–60)
Globulin, Total: 2.4 g/dL (ref 1.5–4.5)
Glucose: 95 mg/dL (ref 70–99)
HDL: 56 mg/dL (ref >39)
Hematocrit: 43.9 % (ref 34.0–46.6)
Hemoglobin: 14.3 g/dL (ref 11.1–15.9)
Immature Grans (Abs): 0 10*3/uL (ref 0.0–0.1)
Immature Granulocytes: 0 %
Iron: 126 ug/dL (ref 27–159)
LDH: 177 IU/L (ref 119–226)
LDL Chol Calc (NIH): 98 mg/dL (ref 0–99)
Lymphocytes Absolute: 2.7 10*3/uL (ref 0.7–3.1)
Lymphs: 36 %
MCH: 31.5 pg (ref 26.6–33.0)
MCHC: 32.6 g/dL (ref 31.5–35.7)
MCV: 97 fL (ref 79–97)
Monocytes Absolute: 0.5 10*3/uL (ref 0.1–0.9)
Monocytes: 7 %
Neutrophils Absolute: 3.8 10*3/uL (ref 1.4–7.0)
Neutrophils: 50 %
Phosphorus: 3.3 mg/dL (ref 3.0–4.3)
Platelets: 284 10*3/uL (ref 150–450)
Potassium: 4.3 mmol/L (ref 3.5–5.2)
RBC: 4.54 x10E6/uL (ref 3.77–5.28)
RDW: 13.4 % (ref 11.7–15.4)
Sodium: 138 mmol/L (ref 134–144)
T3 Uptake Ratio: 28 % (ref 24–39)
T4, Total: 7.5 ug/dL (ref 4.5–12.0)
TSH: 1.94 u[IU]/mL (ref 0.450–4.500)
Total Protein: 7.1 g/dL (ref 6.0–8.5)
Triglycerides: 280 mg/dL — ABNORMAL HIGH (ref 0–149)
Uric Acid: 4.1 mg/dL (ref 3.0–7.2)
VLDL Cholesterol Cal: 47 mg/dL — ABNORMAL HIGH (ref 5–40)
WBC: 7.5 10*3/uL (ref 3.4–10.8)
eGFR: 97 mL/min/{1.73_m2} (ref >59)

## 2023-12-31 ENCOUNTER — Ambulatory Visit: Payer: Self-pay | Admitting: Physician Assistant

## 2023-12-31 ENCOUNTER — Encounter: Payer: Self-pay | Admitting: Physician Assistant

## 2023-12-31 ENCOUNTER — Other Ambulatory Visit: Payer: Self-pay

## 2023-12-31 VITALS — BP 143/85 | HR 75 | Resp 16 | Ht 65.0 in | Wt 171.0 lb

## 2023-12-31 DIAGNOSIS — Z0000 Encounter for general adult medical examination without abnormal findings: Secondary | ICD-10-CM

## 2023-12-31 MED ORDER — MELOXICAM 15 MG PO TABS: ORAL_TABLET | ORAL | 6 refills | Status: AC

## 2023-12-31 NOTE — Progress Notes (Signed)
 [facility] [place] [facility] ____________________________________________   None    (approximate)  I have reviewed the triage vital signs and the nursing notes.   HISTORY  Chief Complaint No chief complaint on file.  HPI Karen Willis is a 58 y.o. female patient presents for annual physical exam.  Patient is aware that her lipid profile shows increased triglycerides and total cholesterol.  Patient states continues to take lovastatin 40 mg daily.  Patient admits to excessive alcohol intake.  Patient will try to modify intake.         Past Medical History:  Diagnosis Date   Anxiety    SITUATIONAL   Dysplastic Nevus 06/14/2020   LUQA, Severe atypia, excised 10/02/20   Dysplastic nevus 06/17/2021   mod-sev, left lateral abdomen near side, excised 07/23/21   Hypercholesteremia     Patient Active Problem List   Diagnosis Date Noted   Migraine headache 09/03/2021   High serum low density lipoprotein (LDL) cholesterol 09/03/2021   Personal history of urinary (tract) infections 09/03/2021   Right knee pain 09/03/2021   Essential hypertension 09/03/2021   Insomnia 09/03/2021   Lumbago 09/03/2021   Tobacco use 10/14/2016    Past Surgical History:  Procedure Laterality Date   CESAREAN SECTION  03/25/1995   BREECH   COLONOSCOPY     MANDIBLE FRACTURE SURGERY Bilateral    AGE 79   TUBAL LIGATION     PJR   WISDOM TOOTH EXTRACTION     20S    Prior to Admission medications   Medication Sig Start Date End Date Taking? Authorizing Provider  amLODipine  (NORVASC ) 5 MG tablet Take 1 tablet (5 mg total) by mouth daily. 01/06/23   Marcina Severe, PA-C  Coenzyme Q10 100 MG CHEW Chew 1 tablet (100 mg total) by mouth Nightly. 12/28/19   Shayne Demark, MD  cyclobenzaprine  (FLEXERIL ) 5 MG tablet Take 1 tablet (5 mg total) by mouth 3 (three) times daily as needed (migraine phenomenon, muscle relaxant). 09/03/21   Edward Graff, PA-C  fluticasone [facility]) 50  MCG/ACT nasal spray USE 1 SPRAY INTO EACH NOSTRIL TWICE A DAY 02/03/18   [provider]  gabapentin  (NEURONTIN ) 300 MG capsule Take 1 capsule (300 mg total) by mouth at bedtime. 04/08/23   Dala Dublin, MD  meloxicam  (MOBIC ) 15 MG tablet TAKE 1 TABLET BY MOUTH EVERY MORNING AS NEEDED 04/03/20   Shayne Demark, MD  rosuvastatin  (CRESTOR ) 40 MG tablet Take 1 tablet (40 mg total) by mouth daily. 01/06/23   Marcina Severe, PA-C    Allergies Macrobid [nitrofurantoin], Sulfa antibiotics, Penicillins, and Ampicillin  Family History  Problem Relation Age of Onset   Hyperlipidemia Mother    Heart disease Father    Stroke Father    Diabetes Father    Hyperlipidemia Sister    Breast cancer Neg Hx     Social History Social History   Tobacco Use   Smoking status: Every Day    Current packs/day: 0.50    Types: Cigarettes   Smokeless tobacco: Never  Vaping Use   Vaping status: Never Used  Substance Use Topics   Alcohol use: Yes    Comment: OCC   Drug use: No    Review of Systems Constitutional: No fever/chills Eyes: No visual changes. ENT: No sore throat. Cardiovascular: Denies chest pain. Respiratory: Denies shortness of breath. Gastrointestinal: No abdominal pain.  No nausea, no vomiting.  No diarrhea.  No constipation. Genitourinary: Negative for dysuria. Musculoskeletal:  Negative for back pain.  Right knee pain. Skin: Negative for rash. Neurological: Negative for headaches, focal weakness or numbness. Psychiatric: Anxiety + insomnia Endocrine: Hyperlipidemia and hypertension  Allergic/Immunilogical: Macrobid, sulfa, penicillin, and ampicillin. ____________________________________________   PHYSICAL EXAM:  VITAL SIGNS: BP 150/86BP. 150/86. Data is abnormal. Taken on 12/31/23 10:21 AM 143/85BP. 143/85. Data is abnormal. Taken on 12/31/23 10:24 AM  Cuff Size -- Normal  Pulse Rate 75 75  Weight 171 lb (77.6 kg) --  Height 5\' 5"  (1.651 m) --  Resp 16 16  SpO2  98 % --   BMI: 28.46 kg/m2  BSA: 1.89 m2   Constitutional: Alert and oriented. Well appearing and in no acute distress. Eyes: Conjunctivae are normal. PERRL. EOMI. Head: Atraumatic. Nose: No congestion/rhinnorhea. Mouth/Throat: Mucous membranes are moist.  Oropharynx non-erythematous. Neck: No stridor.  No cervical spine tenderness to palpation. Hematological/Lymphatic/Immunilogical: No cervical lymphadenopathy. Cardiovascular: Normal rate, regular rhythm. Grossly normal heart sounds.  Good peripheral circulation. Respiratory: Normal respiratory effort.  No retractions. Lungs CTAB. Gastrointestinal: Soft and nontender. No distention. No abdominal bruits. No CVA tenderness. Genitourinary: Deferred Musculoskeletal: No lower extremity tenderness nor edema.  No joint effusions. Neurologic:  Normal speech and language. No gross focal neurologic deficits are appreciated. No gait instability. Skin:  Skin is warm, dry and intact. No rash noted. Psychiatric: Mood and affect are normal. Speech and behavior are normal.  ____________________________________________   LABS          Component Ref Range & Units (hover) 6 d ago 11 mo ago 12 mo ago 2 yr ago 3 yr ago 4 yr ago  Color, UA yellow yellow amber Light Yellow yellow yellow  Clarity, UA clear clear cloudy Clear clear clear  Glucose, UA Negative Negative Negative Negative Negative Negative  Bilirubin, UA neg neg neg Negative negative neg  Ketones, UA neg neg neg Negative negative neg  Spec Grav, UA 1.015 1.010 1.020 1.015 1.015 1.025  Blood, UA 1+ pos CM 1+ Positive CM negative pos CM  pH, UA 7.5 6.0 6.0 7.0 6.0 6.0  Protein, UA Negative Negative Negative Negative Negative Negative  Urobilinogen, UA 0.2 0.2 0.2 0.2 0.2 0.2  Nitrite, UA neg neg neg Negative negative neg  Leukocytes, UA Negative Negative Negative Negative Negative Negative  Appearance  dark   light clear  Odor  none               ____________________________________________  EKG  Sinus rhythm at 64 bpm ____________________________________________    ____________________________________________   INITIAL IMPRESSION / ASSESSMENT AND PLAN / ED COURSE  As part of my medical decision making, I reviewed the following data within the electronic MEDICAL RECORD NUMBER       No acute findings on physical exam no acute findings on physical exam or EKG.  Patient profile is elevated.  Advised to continue Crestor  as directed.  Decrease/modify alcohol intake.  Follow-up in 6 months for lipid panel.      ____________________________________________   FINAL CLINICAL IMPRESSION   ED Discharge Orders     None        Note:  This document was prepared using Dragon voice recognition software and may include unintentional dictation errors.

## 2023-12-31 NOTE — Progress Notes (Signed)
 Pt presents today to complete physical, Pt denies any concerns at this time Gretel Acre

## 2024-01-19 ENCOUNTER — Ambulatory Visit: Payer: Self-pay

## 2024-01-19 ENCOUNTER — Other Ambulatory Visit: Payer: Self-pay | Admitting: Physician Assistant

## 2024-01-19 DIAGNOSIS — R300 Dysuria: Secondary | ICD-10-CM

## 2024-01-19 LAB — POCT URINALYSIS DIPSTICK
Bilirubin, UA: NEGATIVE
Glucose, UA: NEGATIVE
Ketones, UA: NEGATIVE
Nitrite, UA: NEGATIVE
Protein, UA: POSITIVE — AB
Spec Grav, UA: 1.01 (ref 1.010–1.025)
Urobilinogen, UA: 0.2 U/dL
pH, UA: 6 (ref 5.0–8.0)

## 2024-01-19 MED ORDER — PHENAZOPYRIDINE HCL 200 MG PO TABS: 200.0000 mg | ORAL_TABLET | Freq: Three times a day (TID) | ORAL | 0 refills | Status: AC | PRN

## 2024-01-19 MED ORDER — DOXYCYCLINE MONOHYDRATE 100 MG PO CAPS: 100.0000 mg | ORAL_CAPSULE | Freq: Two times a day (BID) | ORAL | 0 refills | Status: AC

## 2024-01-19 NOTE — Progress Notes (Signed)
 Pt presents today with urgency and burning when voiding since Thursday 01/14/24. Pt has tried cranberry juice and AZO and she can not get any relief.

## 2024-01-22 LAB — URINE CULTURE

## 2024-01-29 ENCOUNTER — Other Ambulatory Visit: Payer: Self-pay | Admitting: Physician Assistant

## 2024-01-29 DIAGNOSIS — R7989 Other specified abnormal findings of blood chemistry: Secondary | ICD-10-CM

## 2024-03-25 ENCOUNTER — Other Ambulatory Visit: Payer: Self-pay

## 2024-03-25 DIAGNOSIS — I10 Essential (primary) hypertension: Secondary | ICD-10-CM

## 2024-03-25 MED ORDER — AMLODIPINE BESYLATE 5 MG PO TABS: 5.0000 mg | ORAL_TABLET | Freq: Every day | ORAL | 3 refills | Status: AC

## 2024-03-28 DIAGNOSIS — H524 Presbyopia: Secondary | ICD-10-CM | POA: Diagnosis not present

## 2024-06-24 ENCOUNTER — Other Ambulatory Visit: Payer: Self-pay

## 2024-06-28 ENCOUNTER — Other Ambulatory Visit: Payer: Self-pay

## 2024-06-28 DIAGNOSIS — R7989 Other specified abnormal findings of blood chemistry: Secondary | ICD-10-CM

## 2024-06-28 DIAGNOSIS — R7303 Prediabetes: Secondary | ICD-10-CM

## 2024-06-29 LAB — LIPID PANEL
Chol/HDL Ratio: 3.2 ratio (ref 0.0–4.4)
Cholesterol, Total: 175 mg/dL (ref 100–199)
HDL: 54 mg/dL (ref >39)
LDL Chol Calc (NIH): 86 mg/dL (ref 0–99)
Triglycerides: 207 mg/dL — ABNORMAL HIGH (ref 0–149)
VLDL Cholesterol Cal: 35 mg/dL (ref 5–40)

## 2024-06-29 LAB — HGB A1C W/O EAG: Hgb A1c MFr Bld: 5.9 % — ABNORMAL HIGH (ref 4.8–5.6)

## 2024-08-03 ENCOUNTER — Ambulatory Visit: Admitting: Physician Assistant

## 2024-08-03 DIAGNOSIS — H6593 Unspecified nonsuppurative otitis media, bilateral: Secondary | ICD-10-CM

## 2024-08-03 MED ORDER — PSEUDOEPHEDRINE HCL 60 MG PO TABS: 60.0000 mg | ORAL_TABLET | Freq: Three times a day (TID) | ORAL | 0 refills | Status: AC | PRN

## 2024-08-03 MED ORDER — CLARITHROMYCIN 500 MG PO TABS: 500.0000 mg | ORAL_TABLET | Freq: Two times a day (BID) | ORAL | 0 refills | Status: AC

## 2024-08-03 NOTE — Progress Notes (Signed)
 Pt presents today s/p flu from last week. As of yesterday, she developed pain in right ear and thinks its going to her left.  Right ear is visibly red, left is not.

## 2024-08-03 NOTE — Progress Notes (Signed)
" ° °  Subjective: Bilateral ear pain    Patient ID: Karen Willis, female    DOB: 06/29/1966, 58 y.o.   MRN: [id number]  HPI Patient complaining of bilateral ear pain right greater than left.  Patient status post 1 week ago influenza.  Denies fever/chills.  States muffled sounds.  Denies vertigo.   Review of Systems Hypertension and migraine headaches.    Objective:   Physical Exam HEENT is remarkable for edematous erythematous bilateral TMs. Neck is supple for lymphadenopathy or bruits. Lungs are clear to auscultation. Heart regular rate and rhythm.       Assessment & Plan: Otitis media    "

## 2024-08-08 ENCOUNTER — Other Ambulatory Visit: Payer: Self-pay | Admitting: Physician Assistant

## 2024-08-08 MED ORDER — NEOMYCIN-POLYMYXIN-HC 3.5-10000-1 OT SOLN: 4.0000 [drp] | Freq: Four times a day (QID) | OTIC | 0 refills | Status: AC
